# Patient Record
Sex: Male | Born: 1975 | Race: Black or African American | Hispanic: No | Marital: Married | Smoking: Never smoker
Health system: Southern US, Community
[De-identification: ages and names within clinical notes are randomized; demographics above are authoritative.]

## PROBLEM LIST (undated history)

## (undated) DIAGNOSIS — I10 Essential (primary) hypertension: Secondary | ICD-10-CM

## (undated) DIAGNOSIS — E119 Type 2 diabetes mellitus without complications: Secondary | ICD-10-CM

---

## 2002-05-26 ENCOUNTER — Other Ambulatory Visit: Admission: RE | Admit: 2002-05-26 | Discharge: 2002-05-26 | Payer: Self-pay | Admitting: General Surgery

## 2002-07-23 ENCOUNTER — Ambulatory Visit (HOSPITAL_COMMUNITY): Admission: RE | Admit: 2002-07-23 | Discharge: 2002-07-23 | Payer: Self-pay | Admitting: Family Medicine

## 2002-07-23 ENCOUNTER — Encounter: Payer: Self-pay | Admitting: Family Medicine

## 2004-12-24 ENCOUNTER — Ambulatory Visit: Payer: Self-pay | Admitting: Internal Medicine

## 2006-02-12 ENCOUNTER — Ambulatory Visit: Payer: Self-pay | Admitting: Family Medicine

## 2006-03-12 ENCOUNTER — Ambulatory Visit: Payer: Self-pay | Admitting: Family Medicine

## 2006-05-08 ENCOUNTER — Encounter (INDEPENDENT_AMBULATORY_CARE_PROVIDER_SITE_OTHER): Payer: Self-pay | Admitting: Family Medicine

## 2006-05-13 ENCOUNTER — Ambulatory Visit: Payer: Self-pay | Admitting: Family Medicine

## 2006-08-22 ENCOUNTER — Ambulatory Visit: Payer: Self-pay | Admitting: Family Medicine

## 2006-09-23 ENCOUNTER — Encounter (INDEPENDENT_AMBULATORY_CARE_PROVIDER_SITE_OTHER): Payer: Self-pay | Admitting: Family Medicine

## 2006-11-21 ENCOUNTER — Ambulatory Visit: Payer: Self-pay | Admitting: Family Medicine

## 2006-11-21 DIAGNOSIS — E669 Obesity, unspecified: Secondary | ICD-10-CM

## 2006-11-21 DIAGNOSIS — L290 Pruritus ani: Secondary | ICD-10-CM | POA: Insufficient documentation

## 2006-11-21 DIAGNOSIS — I10 Essential (primary) hypertension: Secondary | ICD-10-CM | POA: Insufficient documentation

## 2006-11-21 DIAGNOSIS — J309 Allergic rhinitis, unspecified: Secondary | ICD-10-CM | POA: Insufficient documentation

## 2006-11-21 LAB — CONVERTED CEMR LAB
Cholesterol, target level: 200 mg/dL
HDL goal, serum: 40 mg/dL
LDL Goal: 160 mg/dL

## 2006-11-26 ENCOUNTER — Encounter (INDEPENDENT_AMBULATORY_CARE_PROVIDER_SITE_OTHER): Payer: Self-pay | Admitting: Family Medicine

## 2006-11-27 LAB — CONVERTED CEMR LAB
ALT: 38 units/L (ref 0–53)
AST: 21 units/L (ref 0–37)
Albumin: 4.4 g/dL (ref 3.5–5.2)
Alkaline Phosphatase: 45 units/L (ref 39–117)
BUN: 15 mg/dL (ref 6–23)
Basophils Absolute: 0 10*3/uL (ref 0.0–0.1)
Basophils Relative: 0 % (ref 0–1)
CO2: 23 meq/L (ref 19–32)
Calcium: 9.1 mg/dL (ref 8.4–10.5)
Chloride: 104 meq/L (ref 96–112)
Cholesterol: 165 mg/dL (ref 0–200)
Creatinine, Ser: 0.95 mg/dL (ref 0.40–1.50)
Eosinophils Absolute: 0.1 10*3/uL (ref 0.0–0.7)
Eosinophils Relative: 1 % (ref 0–5)
Glucose, Bld: 93 mg/dL (ref 70–99)
HCT: 46.3 % (ref 39.0–52.0)
HDL: 44 mg/dL (ref >39)
Hemoglobin: 15.8 g/dL (ref 13.0–17.0)
LDL Cholesterol: 104 mg/dL — ABNORMAL HIGH (ref 0–99)
Lymphocytes Relative: 22 % (ref 12–46)
Lymphs Abs: 1.7 10*3/uL (ref 0.7–3.3)
MCHC: 34.1 g/dL (ref 30.0–36.0)
MCV: 88.5 fL (ref 78.0–100.0)
Monocytes Absolute: 0.6 10*3/uL (ref 0.2–0.7)
Monocytes Relative: 7 % (ref 3–11)
Neutro Abs: 5.3 10*3/uL (ref 1.7–7.7)
Neutrophils Relative %: 69 % (ref 43–77)
Platelets: 205 10*3/uL (ref 150–400)
Potassium: 4 meq/L (ref 3.5–5.3)
RBC: 5.23 M/uL (ref 4.22–5.81)
RDW: 13.8 % (ref 11.5–14.0)
Sodium: 142 meq/L (ref 135–145)
Total Bilirubin: 0.7 mg/dL (ref 0.3–1.2)
Total CHOL/HDL Ratio: 3.8
Total Protein: 7 g/dL (ref 6.0–8.3)
Triglycerides: 83 mg/dL (ref <150)
VLDL: 17 mg/dL (ref 0–40)
WBC: 7.8 10*3/uL (ref 4.0–10.5)

## 2007-02-20 ENCOUNTER — Ambulatory Visit: Payer: Self-pay | Admitting: Family Medicine

## 2007-03-11 ENCOUNTER — Encounter (INDEPENDENT_AMBULATORY_CARE_PROVIDER_SITE_OTHER): Payer: Self-pay | Admitting: Family Medicine

## 2007-04-09 ENCOUNTER — Ambulatory Visit: Payer: Self-pay | Admitting: Family Medicine

## 2007-06-08 ENCOUNTER — Encounter (INDEPENDENT_AMBULATORY_CARE_PROVIDER_SITE_OTHER): Payer: Self-pay | Admitting: Family Medicine

## 2007-07-09 ENCOUNTER — Ambulatory Visit: Payer: Self-pay | Admitting: Family Medicine

## 2007-07-10 ENCOUNTER — Telehealth (INDEPENDENT_AMBULATORY_CARE_PROVIDER_SITE_OTHER): Payer: Self-pay | Admitting: *Deleted

## 2007-07-10 LAB — CONVERTED CEMR LAB
BUN: 16 mg/dL (ref 6–23)
CO2: 21 meq/L (ref 19–32)
Calcium: 9.7 mg/dL (ref 8.4–10.5)
Chloride: 102 meq/L (ref 96–112)
Creatinine, Ser: 1.1 mg/dL (ref 0.40–1.50)
Glucose, Bld: 100 mg/dL — ABNORMAL HIGH (ref 70–99)
Potassium: 4.2 meq/L (ref 3.5–5.3)
Sodium: 139 meq/L (ref 135–145)

## 2007-07-27 ENCOUNTER — Ambulatory Visit: Payer: Self-pay | Admitting: Family Medicine

## 2007-09-23 ENCOUNTER — Ambulatory Visit: Payer: Self-pay | Admitting: Family Medicine

## 2008-01-26 ENCOUNTER — Ambulatory Visit: Payer: Self-pay | Admitting: Family Medicine

## 2008-01-26 LAB — CONVERTED CEMR LAB
Bilirubin Urine: NEGATIVE
Blood in Urine, dipstick: NEGATIVE
Glucose, Urine, Semiquant: NEGATIVE
Ketones, urine, test strip: NEGATIVE
Nitrite: NEGATIVE
Protein, U semiquant: NEGATIVE
Specific Gravity, Urine: 1.01
Urobilinogen, UA: 0.2
WBC Urine, dipstick: NEGATIVE
pH: 5.5

## 2008-01-28 LAB — CONVERTED CEMR LAB
BUN: 15 mg/dL (ref 6–23)
Basophils Absolute: 0 10*3/uL (ref 0.0–0.1)
Basophils Relative: 0 % (ref 0–1)
CO2: 20 meq/L (ref 19–32)
Calcium: 9.5 mg/dL (ref 8.4–10.5)
Chloride: 104 meq/L (ref 96–112)
Creatinine, Ser: 1.06 mg/dL (ref 0.40–1.50)
Eosinophils Absolute: 0.1 10*3/uL (ref 0.0–0.7)
Eosinophils Relative: 1 % (ref 0–5)
Glucose, Bld: 146 mg/dL — ABNORMAL HIGH (ref 70–99)
HCT: 45.5 % (ref 39.0–52.0)
Hemoglobin: 15.6 g/dL (ref 13.0–17.0)
Lymphocytes Relative: 18 % (ref 12–46)
Lymphs Abs: 1.6 10*3/uL (ref 0.7–4.0)
MCHC: 34.3 g/dL (ref 30.0–36.0)
MCV: 86.8 fL (ref 78.0–100.0)
Monocytes Absolute: 0.5 10*3/uL (ref 0.1–1.0)
Monocytes Relative: 5 % (ref 3–12)
Neutro Abs: 6.6 10*3/uL (ref 1.7–7.7)
Neutrophils Relative %: 75 % (ref 43–77)
Platelets: 209 10*3/uL (ref 150–400)
Potassium: 3.5 meq/L (ref 3.5–5.3)
RBC: 5.24 M/uL (ref 4.22–5.81)
RDW: 13.5 % (ref 11.5–15.5)
Sodium: 139 meq/L (ref 135–145)
WBC: 8.8 10*3/uL (ref 4.0–10.5)

## 2008-06-24 ENCOUNTER — Telehealth (INDEPENDENT_AMBULATORY_CARE_PROVIDER_SITE_OTHER): Payer: Self-pay | Admitting: *Deleted

## 2008-08-16 ENCOUNTER — Ambulatory Visit: Payer: Self-pay | Admitting: Family Medicine

## 2008-08-16 DIAGNOSIS — J209 Acute bronchitis, unspecified: Secondary | ICD-10-CM

## 2008-10-05 ENCOUNTER — Ambulatory Visit: Payer: Self-pay | Admitting: Family Medicine

## 2008-10-12 ENCOUNTER — Encounter (INDEPENDENT_AMBULATORY_CARE_PROVIDER_SITE_OTHER): Payer: Self-pay | Admitting: Family Medicine

## 2008-10-13 LAB — CONVERTED CEMR LAB
ALT: 35 units/L (ref 0–53)
AST: 20 units/L (ref 0–37)
Albumin: 4.6 g/dL (ref 3.5–5.2)
Alkaline Phosphatase: 45 units/L (ref 39–117)
BUN: 16 mg/dL (ref 6–23)
Band Neutrophils: 0 % (ref 0–10)
Basophils Absolute: 0 10*3/uL (ref 0.0–0.1)
Basophils Relative: 0 % (ref 0–1)
CO2: 24 meq/L (ref 19–32)
Calcium: 9.5 mg/dL (ref 8.4–10.5)
Chloride: 102 meq/L (ref 96–112)
Cholesterol: 151 mg/dL (ref 0–200)
Creatinine, Ser: 0.99 mg/dL (ref 0.40–1.50)
Eosinophils Absolute: 0.1 10*3/uL (ref 0.0–0.7)
Eosinophils Relative: 1 % (ref 0–5)
Glucose, Bld: 98 mg/dL (ref 70–99)
HCT: 44.5 % (ref 39.0–52.0)
HDL: 45 mg/dL (ref >39)
Hemoglobin: 15.3 g/dL (ref 13.0–17.0)
LDL Cholesterol: 95 mg/dL (ref 0–99)
Lymphocytes Relative: 26 % (ref 12–46)
Lymphs Abs: 2 10*3/uL (ref 0.7–4.0)
MCHC: 34.4 g/dL (ref 30.0–36.0)
MCV: 86.6 fL (ref 78.0–100.0)
Monocytes Absolute: 0.5 10*3/uL (ref 0.1–1.0)
Monocytes Relative: 7 % (ref 3–12)
Neutro Abs: 4.9 10*3/uL (ref 1.7–7.7)
Neutrophils Relative %: 65 % (ref 43–77)
Platelets: 204 10*3/uL (ref 150–400)
Potassium: 4.2 meq/L (ref 3.5–5.3)
RBC: 5.14 M/uL (ref 4.22–5.81)
RDW: 14.4 % (ref 11.5–15.5)
Sodium: 141 meq/L (ref 135–145)
TSH: 1.386 microintl units/mL (ref 0.350–4.500)
Total Bilirubin: 0.6 mg/dL (ref 0.3–1.2)
Total CHOL/HDL Ratio: 3.4
Total Protein: 7.5 g/dL (ref 6.0–8.3)
Triglycerides: 54 mg/dL (ref <150)
VLDL: 11 mg/dL (ref 0–40)
WBC: 7.5 10*3/uL (ref 4.0–10.5)

## 2018-12-22 ENCOUNTER — Other Ambulatory Visit (HOSPITAL_COMMUNITY): Payer: Self-pay | Admitting: Family Medicine

## 2018-12-22 DIAGNOSIS — IMO0002 Reserved for concepts with insufficient information to code with codable children: Secondary | ICD-10-CM

## 2018-12-22 DIAGNOSIS — Z803 Family history of malignant neoplasm of breast: Secondary | ICD-10-CM

## 2018-12-29 ENCOUNTER — Encounter (HOSPITAL_COMMUNITY): Payer: Self-pay

## 2018-12-29 ENCOUNTER — Ambulatory Visit (HOSPITAL_COMMUNITY): Payer: BC Managed Care – PPO

## 2019-03-01 ENCOUNTER — Other Ambulatory Visit (HOSPITAL_COMMUNITY): Payer: Self-pay | Admitting: Family Medicine

## 2019-03-01 ENCOUNTER — Ambulatory Visit (HOSPITAL_COMMUNITY)
Admission: RE | Admit: 2019-03-01 | Discharge: 2019-03-01 | Disposition: A | Discharge status: Home / Self Care | Payer: BC Managed Care – PPO | Source: Ambulatory Visit | Attending: Family Medicine | Admitting: Family Medicine

## 2019-03-01 ENCOUNTER — Other Ambulatory Visit: Payer: Self-pay

## 2019-03-01 DIAGNOSIS — R609 Edema, unspecified: Secondary | ICD-10-CM | POA: Diagnosis present

## 2019-09-02 ENCOUNTER — Ambulatory Visit: Payer: BC Managed Care – PPO

## 2019-09-02 ENCOUNTER — Encounter: Payer: Self-pay | Admitting: Orthopaedic Surgery

## 2019-09-02 ENCOUNTER — Other Ambulatory Visit: Payer: Self-pay

## 2019-09-02 ENCOUNTER — Ambulatory Visit (INDEPENDENT_AMBULATORY_CARE_PROVIDER_SITE_OTHER): Payer: BC Managed Care – PPO | Admitting: Orthopaedic Surgery

## 2019-09-02 VITALS — BP 121/80 | HR 83 | Ht 66.5 in | Wt 290.0 lb

## 2019-09-02 DIAGNOSIS — Z6841 Body Mass Index (BMI) 40.0 and over, adult: Secondary | ICD-10-CM | POA: Diagnosis not present

## 2019-09-02 DIAGNOSIS — M654 Radial styloid tenosynovitis [de Quervain]: Secondary | ICD-10-CM | POA: Diagnosis not present

## 2019-09-02 DIAGNOSIS — M25531 Pain in right wrist: Secondary | ICD-10-CM

## 2019-09-02 NOTE — Progress Notes (Signed)
Subjective:    Patient ID: Jeremiah Garner, male    DOB: 04-Dec-1975, 44 y.o.   MRN: [id number]  HPI He has had pain in the right dominant wrist over the first extensor compartment for about two to three months.  It hurts to lift a heavy object.  He has no swelling, no trauma, no redness. He has seen Jeremiah Garner for this and I have reviewed the notes.  It is not getting better with rest, heat, Advil.   Review of Systems  Constitutional: Positive for activity change.  Musculoskeletal: Positive for arthralgias.  All other systems reviewed and are negative.  For Review of Systems, all other systems reviewed and are negative.  The following is a summary of the past history medically, past history surgically, known current medicines, social history and family history.  This information is gathered electronically by the computer from prior information and documentation.  I review this each visit and have found including this information at this point in the chart is beneficial and informative.   History reviewed. No pertinent past medical history.  History reviewed. No pertinent surgical history.  Current Outpatient Medications on File Prior to Visit  Medication Sig Dispense Refill  . diltiazem (TIAZAC) 360 MG 24 hr capsule Take 360 mg by mouth daily.    . furosemide (LASIX) 20 MG tablet Take 20 mg by mouth daily.    Marland Kitchen ibuprofen (ADVIL) 800 MG tablet     . spironolactone (ALDACTONE) 25 MG tablet Take 25 mg by mouth 2 (two) times daily.     No current facility-administered medications on file prior to visit.    Social History   Socioeconomic History  . Marital status: Married    Spouse name: Not on file  . Number of children: Not on file  . Years of education: Not on file  . Highest education level: Not on file  Occupational History  . Not on file  Tobacco Use  . Smoking status: Never Smoker  . Smokeless tobacco: Never Used  Substance and Sexual Activity  . Alcohol use: Not on  file  . Drug use: Not on file  . Sexual activity: Not on file  Other Topics Concern  . Not on file  Social History Narrative  . Not on file   Social Determinants of Health   Financial Resource Strain:   . Difficulty of Paying Living Expenses: Not on file  Food Insecurity:   . Worried About Programme researcher, broadcasting/film/video in the Last Year: Not on file  . Ran Out of Food in the Last Year: Not on file  Transportation Needs:   . Lack of Transportation (Medical): Not on file  . Lack of Transportation (Non-Medical): Not on file  Physical Activity:   . Days of Exercise per Week: Not on file  . Minutes of Exercise per Session: Not on file  Stress:   . Feeling of Stress : Not on file  Social Connections:   . Frequency of Communication with Friends and Family: Not on file  . Frequency of Social Gatherings with Friends and Family: Not on file  . Attends Religious Services: Not on file  . Active Member of Clubs or Organizations: Not on file  . Attends Banker Meetings: Not on file  . Marital Status: Not on file  Intimate Partner Violence:   . Fear of Current or Ex-Partner: Not on file  . Emotionally Abused: Not on file  . Physically Abused: Not on file  .  Sexually Abused: Not on file    Family History  Problem Relation Age of Onset  . Cancer Mother        breast   . High blood pressure Father   . Parkinson's disease Father     BP 121/80   Pulse 83   Ht 5' 6.5" (1.689 m)   Wt 290 lb (131.5 kg)   BMI 46.11 kg/m   Body mass index is 46.11 kg/m.  The patient meets the AMA guidelines for Morbid (severe) obesity with a BMI > 40.0 and I have recommended weight loss.      Objective:   Physical Exam Vitals and nursing note reviewed.  Constitutional:      Appearance: He is well-developed.  HENT:     Head: Normocephalic and atraumatic.  Eyes:     Conjunctiva/sclera: Conjunctivae normal.     Pupils: Pupils are equal, round, and reactive to light.  Cardiovascular:      Rate and Rhythm: Normal rate and regular rhythm.  Pulmonary:     Effort: Pulmonary effort is normal.  Abdominal:     Palpations: Abdomen is soft.  Musculoskeletal:       Hands:     Cervical back: Normal range of motion and neck supple.  Skin:    General: Skin is warm and dry.  Neurological:     Mental Status: He is alert and oriented to person, place, and time.     Cranial Nerves: No cranial nerve deficit.     Motor: No abnormal muscle tone.     Coordination: Coordination normal.     Deep Tendon Reflexes: Reflexes are normal and symmetric. Reflexes normal.  Psychiatric:        Behavior: Behavior normal.        Thought Content: Thought content normal.        Judgment: Judgment normal.    X-rays were done of the right wrist, reported separately.       Assessment & Plan:   Encounter Diagnoses  Name Primary?  . Pain in right wrist Yes  . De Quervain's tenosynovitis, right   . Body mass index 45.0-49.9, adult ([place])   . Morbid obesity ([place])    Procedure note: After permission from the patient and sterile prep, the first extensor compartment of the right wrist was injected with 1% Xylocaine plain and 1 cc of DepoMedrol 40 by sterile technique and tolerated well.  A thumb splint is given.  Use Aspercreme to the area tid.  Use ice as needed.  Return in two weeks.  Call if any problem.  Precautions discussed.   Electronically Signed Jeremiah Kava, MD 1/28/20218:39 AM

## 2019-09-16 ENCOUNTER — Ambulatory Visit (INDEPENDENT_AMBULATORY_CARE_PROVIDER_SITE_OTHER): Payer: BC Managed Care – PPO | Admitting: Orthopaedic Surgery

## 2019-09-16 ENCOUNTER — Encounter: Payer: Self-pay | Admitting: Orthopaedic Surgery

## 2019-09-16 ENCOUNTER — Other Ambulatory Visit: Payer: Self-pay

## 2019-09-16 VITALS — BP 132/90 | HR 80 | Ht 66.5 in | Wt 285.0 lb

## 2019-09-16 DIAGNOSIS — M654 Radial styloid tenosynovitis [de Quervain]: Secondary | ICD-10-CM | POA: Diagnosis not present

## 2019-09-16 DIAGNOSIS — Z6841 Body Mass Index (BMI) 40.0 and over, adult: Secondary | ICD-10-CM

## 2019-09-16 NOTE — Progress Notes (Signed)
Patient MW:NUUVOZD Jeremiah Garner, male DOB:1976-06-20, 44 y.o. GUY:[id number]  Chief Complaint  Patient presents with  . Hand Pain    right/ feels better     HPI  Jeremiah Garner is a 44 y.o. male who has Harriet Pho of the right dominant hand first compartment.  I gave him an injection last time and it has helped.  He has been using his brace.  He has no swelling.  He has some twinges of pain but is much improved.  I told him to stop the splint and use Aspercreme or BioFreeze as needed.   Body mass index is 45.31 kg/m.  The patient meets the AMA guidelines for Morbid (severe) obesity with a BMI > 40.0 and I have recommended weight loss.   ROS  Review of Systems  Constitutional: Positive for activity change.  Musculoskeletal: Positive for arthralgias.  All other systems reviewed and are negative.   All other systems reviewed and are negative.  The following is a summary of the past history medically, past history surgically, known current medicines, social history and family history.  This information is gathered electronically by the computer from prior information and documentation.  I review this each visit and have found including this information at this point in the chart is beneficial and informative.    History reviewed. No pertinent past medical history.  History reviewed. No pertinent surgical history.  Family History  Problem Relation Age of Onset  . Cancer Mother        breast   . High blood pressure Father   . Parkinson's disease Father     Social History Social History   Tobacco Use  . Smoking status: Never Smoker  . Smokeless tobacco: Never Used  Substance Use Topics  . Alcohol use: Not on file  . Drug use: Not on file    Not on File  Current Outpatient Medications  Medication Sig Dispense Refill  . diltiazem (TIAZAC) 360 MG 24 hr capsule Take 360 mg by mouth daily.    . furosemide (LASIX) 20 MG tablet Take 20 mg by mouth daily.    Marland Kitchen ibuprofen  (ADVIL) 800 MG tablet     . spironolactone (ALDACTONE) 25 MG tablet Take 25 mg by mouth 2 (two) times daily.     No current facility-administered medications for this visit.     Physical Exam  Blood pressure 132/90, pulse 80, height 5' 6.5" (1.689 m), weight 285 lb (129.3 kg).  Constitutional: overall normal hygiene, normal nutrition, well developed, normal grooming, normal body habitus. Assistive device:right thumb splint  Musculoskeletal: gait and station Limp none, muscle tone and strength are normal, no tremors or atrophy is present.  .  Neurological: coordination overall normal.  Deep tendon reflex/nerve stretch intact.  Sensation normal.  Cranial nerves II-XII intact.   Skin:   Normal overall no scars, lesions, ulcers or rashes. No psoriasis.  Psychiatric: Alert and oriented x 3.  Recent memory intact, remote memory unclear.  Normal mood and affect. Well groomed.  Good eye contact.  Cardiovascular: overall no swelling, no varicosities, no edema bilaterally, normal temperatures of the legs and arms, no clubbing, cyanosis and good capillary refill.  Lymphatic: palpation is normal.  His right thumb first extensor compartment is tender but not painful. He has negative Finklestein sign.  He has full ROM and no swelling or redness.  NV intact.  All other systems reviewed and are negative   The patient has been educated about the nature of  the problem(s) and counseled on treatment options.  The patient appeared to understand what I have discussed and is in agreement with it.  Encounter Diagnoses  Name Primary?  Tommi Rumps Quervain's tenosynovitis, right Yes  . Body mass index 45.0-49.9, adult ([facility])   . Morbid obesity ([facility])     PLAN Call if any problems.  Precautions discussed.  Continue current medications.   Return to clinic 1 month   Electronically Signed Darreld Mclean, MD 2/11/20218:36 AM

## 2019-10-14 ENCOUNTER — Ambulatory Visit: Payer: BC Managed Care – PPO | Attending: Family

## 2019-10-14 ENCOUNTER — Ambulatory Visit: Payer: BC Managed Care – PPO | Admitting: Orthopaedic Surgery

## 2019-10-14 DIAGNOSIS — Z23 Encounter for immunization: Secondary | ICD-10-CM

## 2019-10-14 NOTE — Progress Notes (Signed)
   Covid-19 Vaccination Clinic  Name:  Jeremiah Garner    MRN: [id number] DOB: 1976-01-24  10/14/2019  [name] was observed post Covid-19 immunization for 15 minutes without incident. He was provided with Vaccine Information Sheet and instruction to access the V-Safe system.   [name] was instructed to call 911 with any severe reactions post vaccine: Marland Kitchen Difficulty breathing  . Swelling of face and throat  . A fast heartbeat  . A bad rash all over body  . Dizziness and weakness   Immunizations Administered    Name Date Dose VIS Date Route   Moderna COVID-19 Vaccine 10/14/2019 10:35 AM 0.5 mL 07/06/2019 Intramuscular   Manufacturer: Moderna   Lot: [id number]   NDC: 16553-748-27

## 2019-11-16 ENCOUNTER — Ambulatory Visit: Payer: BC Managed Care – PPO | Attending: Family

## 2019-11-16 DIAGNOSIS — Z23 Encounter for immunization: Secondary | ICD-10-CM

## 2019-11-16 NOTE — Progress Notes (Signed)
   Covid-19 Vaccination Clinic  Name:  Jeremiah Garner    MRN: [id number] DOB: 1975-08-22  11/16/2019  [name] was observed post Covid-19 immunization for 15 minutes without incident. He was provided with Vaccine Information Sheet and instruction to access the V-Safe system.   [name] was instructed to call 911 with any severe reactions post vaccine: Marland Kitchen Difficulty breathing  . Swelling of face and throat  . A fast heartbeat  . A bad rash all over body  . Dizziness and weakness   Immunizations Administered    Name Date Dose VIS Date Route   Moderna COVID-19 Vaccine 11/16/2019 11:23 AM 0.5 mL 07/06/2019 Intramuscular   Manufacturer: Moderna   Lot: [id number]   NDC: 49865-168-61

## 2020-06-05 ENCOUNTER — Ambulatory Visit: Payer: BC Managed Care – PPO | Attending: Family

## 2020-06-05 DIAGNOSIS — Z23 Encounter for immunization: Secondary | ICD-10-CM

## 2020-08-02 NOTE — Progress Notes (Signed)
   Covid-19 Vaccination Clinic  Name:  Jeremiah Garner    MRN: [id number] DOB: 08-01-1976  08/02/2020  [name] was observed post Covid-19 immunization for 15 minutes without incident. He was provided with Vaccine Information Sheet and instruction to access the V-Safe system.   [name] was instructed to call 911 with any severe reactions post vaccine: Marland Kitchen Difficulty breathing  . Swelling of face and throat  . A fast heartbeat  . A bad rash all over body  . Dizziness and weakness   Immunizations Administered    Name Date Dose VIS Date Route   Moderna Covid-19 Booster Vaccine 06/05/2020 11:30 AM 0.25 mL 05/24/2020 Intramuscular   Manufacturer: Moderna   Lot: [id number]   NDC: 47425-956-38

## 2020-08-22 ENCOUNTER — Ambulatory Visit (INDEPENDENT_AMBULATORY_CARE_PROVIDER_SITE_OTHER): Payer: BC Managed Care – PPO | Admitting: Orthopaedic Surgery

## 2020-08-22 ENCOUNTER — Encounter: Payer: Self-pay | Admitting: Orthopaedic Surgery

## 2020-08-22 ENCOUNTER — Other Ambulatory Visit: Payer: Self-pay

## 2020-08-22 VITALS — BP 134/84 | HR 82 | Ht 66.5 in | Wt 289.0 lb

## 2020-08-22 DIAGNOSIS — M654 Radial styloid tenosynovitis [de Quervain]: Secondary | ICD-10-CM

## 2020-08-22 DIAGNOSIS — Z6841 Body Mass Index (BMI) 40.0 and over, adult: Secondary | ICD-10-CM

## 2020-08-22 NOTE — Progress Notes (Signed)
He has recurrent Jeremiah Garner on the right.  Procedure note:  After permission from the patient the first extensor compartment of the right wrist was prepped and injected with 1 cc DepoMedrol 40 and 1 % plain Xylocaine by sterile technique tolerated well.  A thumb splint was given.  Encounter Diagnoses  Name Primary?  Tommi Rumps Quervain's tenosynovitis, right Yes  . Body mass index 45.0-49.9, adult ([facility])   . Morbid obesity ([facility])    Return as needed.  Call if any problem.  Precautions discussed.   Electronically Signed Darreld Mclean, MD 1/18/202212:20 PM

## 2020-12-18 ENCOUNTER — Encounter: Payer: Self-pay | Admitting: Emergency Medicine

## 2020-12-18 ENCOUNTER — Ambulatory Visit
Admission: EM | Admit: 2020-12-18 | Discharge: 2020-12-18 | Disposition: A | Discharge status: Home / Self Care | Payer: BC Managed Care – PPO | Attending: Family Medicine | Admitting: Family Medicine

## 2020-12-18 ENCOUNTER — Other Ambulatory Visit: Payer: Self-pay

## 2020-12-18 DIAGNOSIS — J209 Acute bronchitis, unspecified: Secondary | ICD-10-CM

## 2020-12-18 DIAGNOSIS — J019 Acute sinusitis, unspecified: Secondary | ICD-10-CM

## 2020-12-18 MED ORDER — IPRATROPIUM BROMIDE 0.03 % NA SOLN: 2.0000 | Freq: Three times a day (TID) | NASAL | 0 refills | Status: DC | PRN

## 2020-12-18 MED ORDER — PROMETHAZINE-DM 6.25-15 MG/5ML PO SYRP
5.0000 mL | ORAL_SOLUTION | Freq: Three times a day (TID) | ORAL | 0 refills | Status: DC | PRN
Start: 2020-12-18 — End: 2022-10-30

## 2020-12-18 MED ORDER — DOXYCYCLINE HYCLATE 100 MG PO CAPS: 100.0000 mg | ORAL_CAPSULE | Freq: Two times a day (BID) | ORAL | 0 refills | Status: AC

## 2020-12-18 NOTE — ED Triage Notes (Signed)
Pt here for constant runny nose onset 4 days associated w/nausea  Denies f/v/d, SOB, dyspnea  Taking Flonase nasal spray w/some relief  A&O x4... NAD.Marland Kitchen. ambulatory

## 2020-12-18 NOTE — ED Provider Notes (Signed)
[facility]    CSN: [id number] Arrival date & time: 12/18/20  1604      History   Chief Complaint Chief Complaint  Patient presents with  . URI    HPI Jeremiah Garner is a 45 y.o. male.   HPI  Patient presents with URI symptoms including cough, nasal congestion, runny nose, and nausea x 4 days. He uses a CPAP at bedtime and is concern for infection related to machine. Cough is non productive. Most worrisome symptoms nasal congestion and drainage. Afebrile a present. Denies worrisome symptoms of shortness of breath, weakness, N&V,or  chest pain.   History reviewed. No pertinent past medical history.  Patient Active Problem List   Diagnosis Date Noted  . BRONCHITIS, ACUTE 08/16/2008  . OBESITY 11/21/2006  . ESSENTIAL HYPERTENSION 11/21/2006  . ALLERGIC RHINITIS 11/21/2006  . PRURITUS ANI 11/21/2006    History reviewed. No pertinent surgical history.     Home Medications    Prior to Admission medications   Medication Sig Start Date End Date Taking? Authorizing Provider  doxycycline (VIBRAMYCIN) 100 MG capsule Take 1 capsule (100 mg total) by mouth 2 (two) times daily for 7 days. 12/18/20 12/25/20 Yes Bing Neighbors, FNP  ipratropium (ATROVENT) 0.03 % nasal spray Place 2 sprays into both nostrils 3 (three) times daily as needed for rhinitis. 12/18/20  Yes Bing Neighbors, FNP  promethazine-dextromethorphan (PROMETHAZINE-DM) 6.25-15 MG/5ML syrup Take 5 mLs by mouth 3 (three) times daily as needed for cough. 12/18/20  Yes Bing Neighbors, FNP  diltiazem (TIAZAC) 360 MG 24 hr capsule Take 360 mg by mouth daily. 07/21/19   [provider]  furosemide (LASIX) 20 MG tablet Take 20 mg by mouth daily. 08/11/19   [provider]  ibuprofen (ADVIL) 800 MG tablet  08/09/19   [provider]  spironolactone (ALDACTONE) 25 MG tablet Take 25 mg by mouth 2 (two) times daily. 07/19/19   [provider]    Family History Family  History  Problem Relation Age of Onset  . Cancer Mother        breast   . High blood pressure Father   . Parkinson's disease Father     Social History Social History   Tobacco Use  . Smoking status: Never Smoker  . Smokeless tobacco: Never Used     Allergies   Patient has no known allergies.   Review of Systems Review of Systems Pertinent negatives listed in HPI   Physical Exam Triage Vital Signs ED Triage Vitals  Enc Vitals Group     BP 12/18/20 1652 125/80     Pulse Rate 12/18/20 1652 78     Resp 12/18/20 1652 18     Temp 12/18/20 1652 98 F (36.7 C)     Temp Source 12/18/20 1652 Oral     SpO2 12/18/20 1652 97 %     Weight --      Height --      Head Circumference --      Peak Flow --      Pain Score 12/18/20 1658 0     Pain Loc --      Pain Edu? --      Excl. in GC? --    No data found.  Updated Vital Signs BP 125/80   Pulse 78   Temp 98 F (36.7 C) (Oral)   Resp 18   SpO2 97%   Visual Acuity Right Eye Distance:   Left Eye Distance:  Bilateral Distance:    Right Eye Near:   Left Eye Near:    Bilateral Near:     Physical Exam General appearance: alert, Ill-appearing, no distress Head: Normocephalic, without obvious abnormality, atraumatic ENT:Ear normal, nares with mucosal edema, congestion, oropharynx w/o exudate Respiratory: Respirations even , unlabored, coarse lung sound, no wheezing or rales Heart: rate and rhythm normal. No gallop or murmurs noted on exam  Abdomen: BS +, no distention, no rebound tenderness, or no mass Extremities: No gross deformities Skin: Skin color, texture, turgor normal. No rashes seen  Psych: Appropriate mood and affect. Neurologic: no obvious neurological deficit present on exam  UC Treatments / Results  Labs (all labs ordered are listed, but only abnormal results are displayed) Labs Reviewed - No data to display  EKG   Radiology No results found.  Procedures Procedures (including critical care  time)  Medications Ordered in UC Medications - No data to display  Initial Impression / Assessment and Plan / UC Course  I have reviewed the triage vital signs and the nursing notes.  Pertinent labs & imaging results that were available during my care of the patient were reviewed by me and considered in my medical decision making (see chart for details).    Treating based on clinical findings which are consistent with both sinusitis and acute bronchitis, unable to rule out viral source as patient declined COVID/Flu testing. Treatment per discharge medications. PCP follow-up if symptoms do not readily resolve with prescribed therapy. ER if breathing difficulty develops.  Final Clinical Impressions(s) / UC Diagnoses   Final diagnoses:  Acute bronchitis, unspecified organism  Acute non-recurrent sinusitis, unspecified location   Discharge Instructions   None    ED Prescriptions    Medication Sig Dispense Auth. Provider   doxycycline (VIBRAMYCIN) 100 MG capsule Take 1 capsule (100 mg total) by mouth 2 (two) times daily for 7 days. 14 capsule Bing Neighbors, FNP   promethazine-dextromethorphan (PROMETHAZINE-DM) 6.25-15 MG/5ML syrup Take 5 mLs by mouth 3 (three) times daily as needed for cough. 140 mL Bing Neighbors, FNP   ipratropium (ATROVENT) 0.03 % nasal spray Place 2 sprays into both nostrils 3 (three) times daily as needed for rhinitis. 30 mL Bing Neighbors, FNP     PDMP not reviewed this encounter.   Bing Neighbors, FNP 12/20/20 [id number]

## 2021-02-06 ENCOUNTER — Ambulatory Visit: Payer: BC Managed Care – PPO | Attending: Family

## 2021-02-06 ENCOUNTER — Other Ambulatory Visit: Payer: Self-pay

## 2021-02-06 DIAGNOSIS — Z23 Encounter for immunization: Secondary | ICD-10-CM

## 2021-02-06 NOTE — Progress Notes (Signed)
   Covid-19 Vaccination Clinic  Name:  Jeremiah Garner    MRN: [id number] DOB: 1976-01-23  02/06/2021  [name] was observed post Covid-19 immunization for 15 minutes without incident. He was provided with Vaccine Information Sheet and instruction to access the V-Safe system.   [name] was instructed to call 911 with any severe reactions post vaccine: Difficulty breathing  Swelling of face and throat  A fast heartbeat  A bad rash all over body  Dizziness and weakness   Immunizations Administered     Name Date Dose VIS Date Route   Moderna Covid-19 Booster Vaccine 02/06/2021  5:10 PM 0.25 mL 05/24/2020 Intramuscular   Manufacturer: Moderna   Lot: [id number]   NDC: 44010-272-53

## 2021-03-14 IMAGING — DX CHEST - 2 VIEW
2 series · 2 of 2 positions shown · non-contrast
Comparison: None.

CLINICAL DATA: Chronic lower extremity edema.  Hypertension.

EXAM:
CHEST - 2 VIEW

[chest pa]
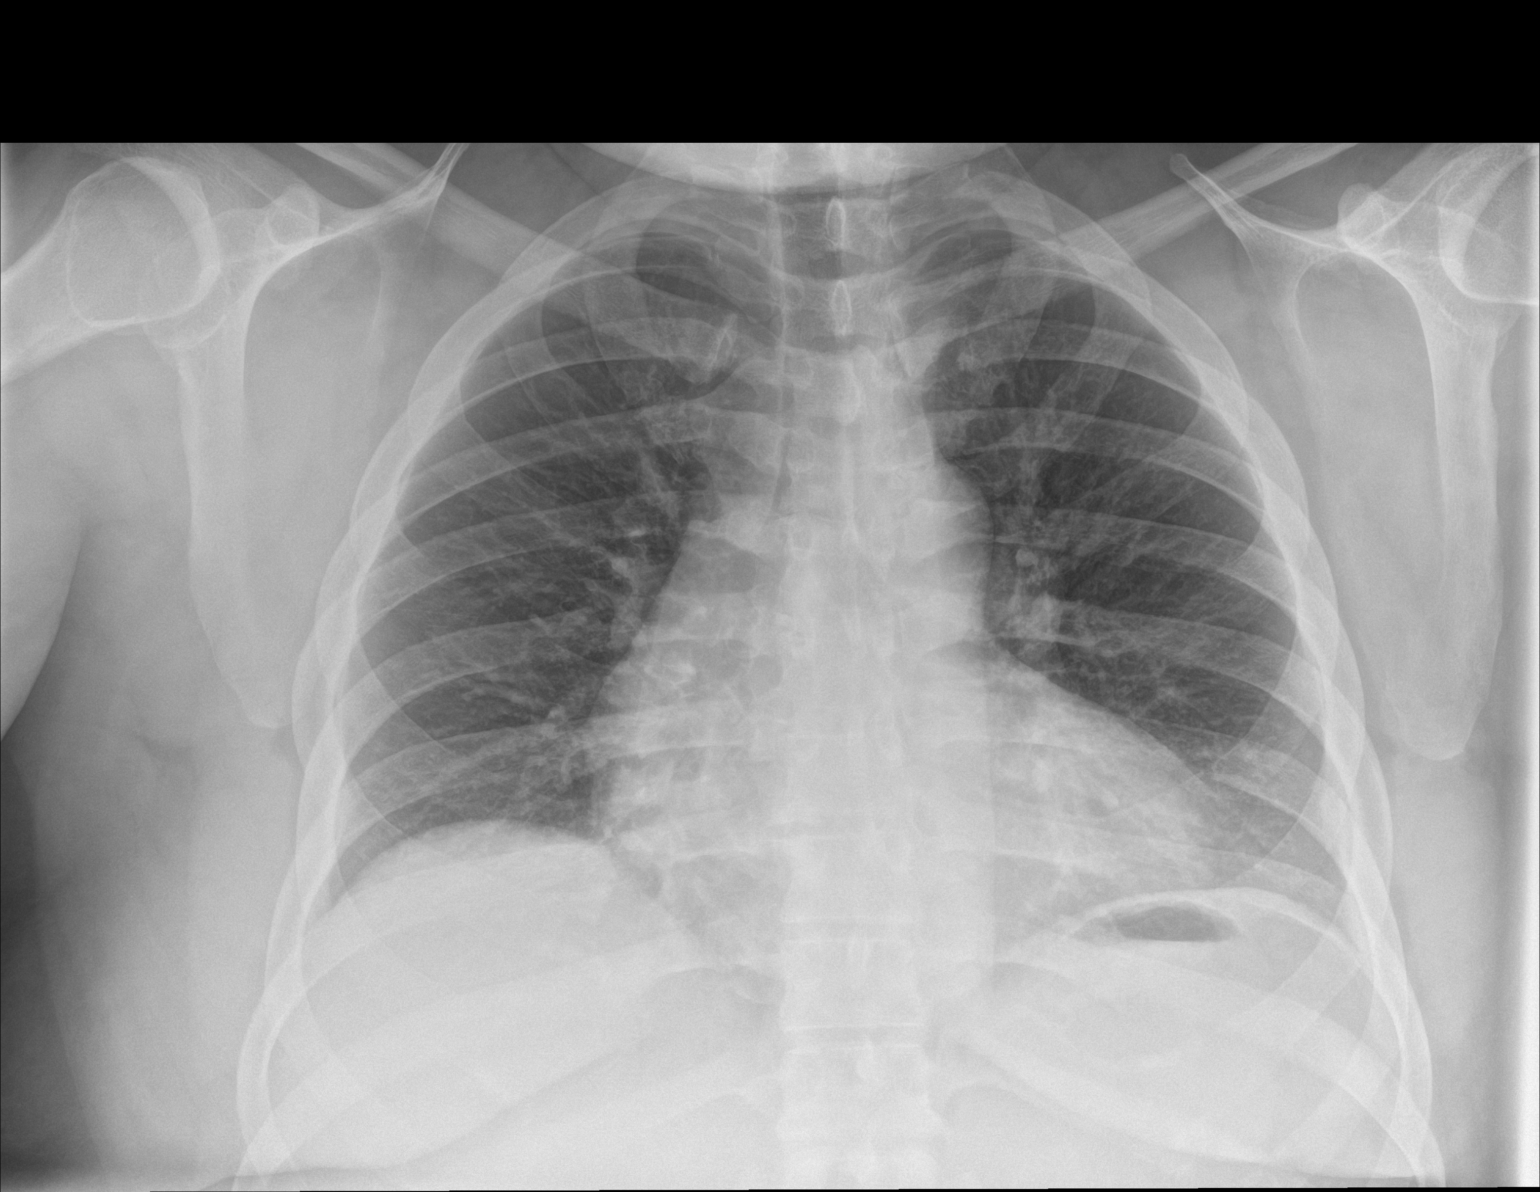

[chest lat]
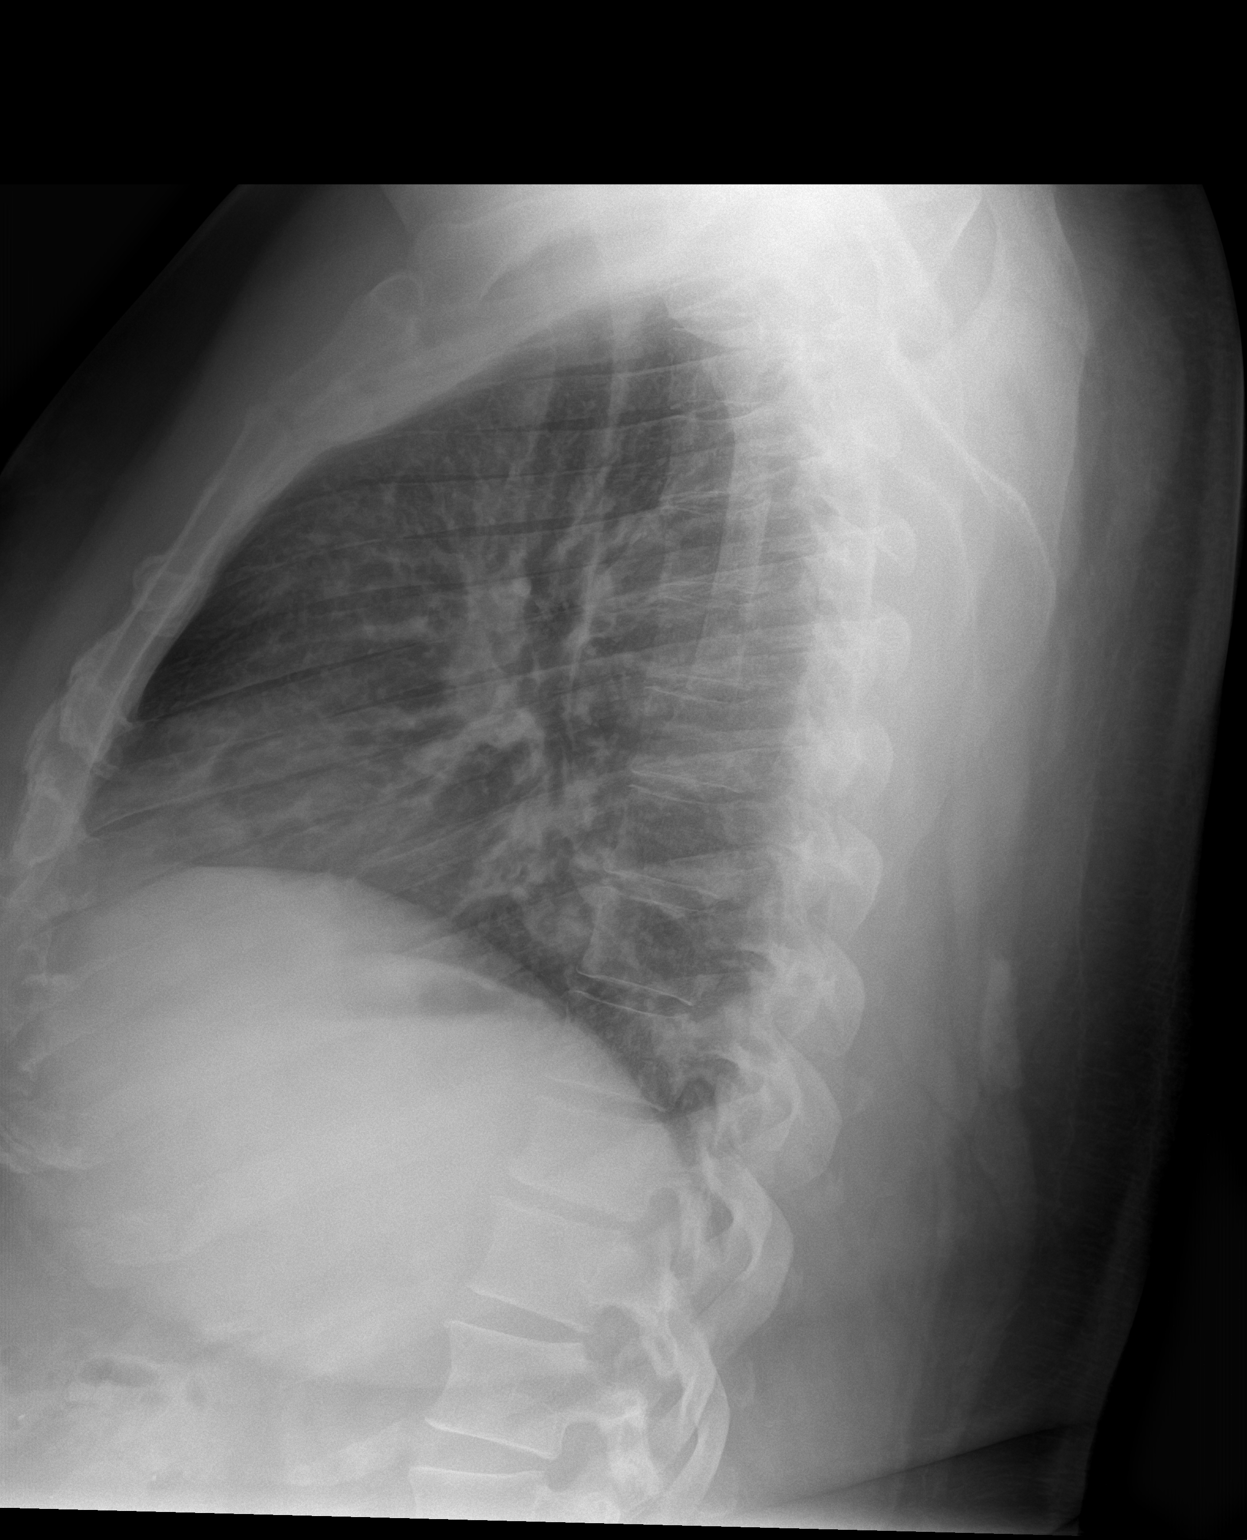

[2 of 2 positions shown; findings below may reference images not displayed]

FINDINGS: Mild enlargement of the heart. Negative for heart failure. Lungs
well aerated and clear. No infiltrate effusion or mass.
IMPRESSION: No active cardiopulmonary disease.

## 2021-06-25 ENCOUNTER — Ambulatory Visit: Payer: BC Managed Care – PPO | Attending: Family

## 2021-06-25 DIAGNOSIS — Z23 Encounter for immunization: Secondary | ICD-10-CM

## 2021-06-25 NOTE — Progress Notes (Signed)
   Covid-19 Vaccination Clinic  Name:  Jeremiah Garner    MRN: [id number] DOB: 02-05-1976  06/25/2021  [name] was observed post Covid-19 immunization for 15 minutes without incident. He was provided with Vaccine Information Sheet and instruction to access the V-Safe system.   [name] was instructed to call 911 with any severe reactions post vaccine: Difficulty breathing  Swelling of face and throat  A fast heartbeat  A bad rash all over body  Dizziness and weakness   Immunizations Administered     Name Date Dose VIS Date Route   Pfizer Covid-19 Vaccine Bivalent Booster 06/25/2021 12:00 PM 0.3 mL 04/04/2021 Intramuscular   Manufacturer: [employer]   Lot: [id number]   NDC: [id number]

## 2021-11-30 ENCOUNTER — Encounter: Payer: Self-pay | Admitting: Emergency Medicine

## 2021-11-30 ENCOUNTER — Other Ambulatory Visit: Payer: Self-pay

## 2021-11-30 ENCOUNTER — Ambulatory Visit
Admission: EM | Admit: 2021-11-30 | Discharge: 2021-11-30 | Disposition: A | Discharge status: Home / Self Care | Payer: BC Managed Care – PPO | Attending: Nurse Practitioner | Admitting: Nurse Practitioner

## 2021-11-30 DIAGNOSIS — J309 Allergic rhinitis, unspecified: Secondary | ICD-10-CM | POA: Diagnosis not present

## 2021-11-30 DIAGNOSIS — R051 Acute cough: Secondary | ICD-10-CM | POA: Diagnosis not present

## 2021-11-30 HISTORY — DX: Type 2 diabetes mellitus without complications: E11.9

## 2021-11-30 MED ORDER — CETIRIZINE HCL 10 MG PO TABS: 10.0000 mg | ORAL_TABLET | Freq: Every day | ORAL | 0 refills | Status: AC

## 2021-11-30 MED ORDER — FLUTICASONE PROPIONATE 50 MCG/ACT NA SUSP: 2.0000 | Freq: Every day | NASAL | 0 refills | Status: DC

## 2021-11-30 MED ORDER — PSEUDOEPH-BROMPHEN-DM 30-2-10 MG/5ML PO SYRP: 5.0000 mL | ORAL_SOLUTION | Freq: Four times a day (QID) | ORAL | 0 refills | Status: DC | PRN

## 2021-11-30 NOTE — Discharge Instructions (Addendum)
Take medication as prescribed. ?Increase fluids and get plenty of rest. ?May use over-the-counter normal saline nasal spray. ?May use a humidifier at bedtime to help with cough and nasal congestion.  Also recommending sleeping elevated on 2 pillows. ?Follow-up if your symptoms worsen to include fever, chills, worsening cough, or other concerns. ?

## 2021-11-30 NOTE — ED Provider Notes (Signed)
?[facility] ? ? ? ?CSN: [id number] ?Arrival date & time: 11/30/21  8101 ? ? ?  ? ?History   ?Chief Complaint ?Chief Complaint  ?Patient presents with  ? Nasal Congestion  ? ? ?HPI ?HAYZE GAZDA is a 46 y.o. male.  ? ?The patient is a 46 year old male who presents for upper respiratory symptoms.  Symptoms started in the past 4 to 5 days.  Patient states that he noticed runny nose about 2 to 3 days prior.  States that he mowed his yard, and noticed the symptoms starting to worsen.  States that he also recently returned from out of town and noticed worsening symptoms.  Today he complains of continued nasal congestion, cough, runny nose, and intermittent sore throat.  He states that the cough is persistent, and does not change throughout the day.  He also admits to intermittent wheezing.  He denies fever, chills, headache, shortness of breath, or GI symptoms.  Patient states he did try diabetic Teston and Nasonex without relief.  Patient admits that he has a history of seasonal allergies, diabetes, and hypertension. ? ?The history is provided by the patient.  ? ?Past Medical History:  ?Diagnosis Date  ? Diabetes mellitus without complication (HCC)   ? ? ?Patient Active Problem List  ? Diagnosis Date Noted  ? BRONCHITIS, ACUTE 08/16/2008  ? OBESITY 11/21/2006  ? ESSENTIAL HYPERTENSION 11/21/2006  ? ALLERGIC RHINITIS 11/21/2006  ? PRURITUS ANI 11/21/2006  ? ? ?History reviewed. No pertinent surgical history. ? ? ? ? ?Home Medications   ? ?Prior to Admission medications   ?Medication Sig Start Date End Date Taking? Authorizing Provider  ?diltiazem (TIAZAC) 360 MG 24 hr capsule Take 360 mg by mouth daily. 07/21/19   [provider]  ?furosemide (LASIX) 20 MG tablet Take 20 mg by mouth daily. 08/11/19   [provider]  ?ibuprofen (ADVIL) 800 MG tablet  08/09/19   [provider]  ?ipratropium (ATROVENT) 0.03 % nasal spray Place 2 sprays into both nostrils 3 (three) times daily as needed  for rhinitis. 12/18/20   Bing Neighbors, FNP  ?promethazine-dextromethorphan (PROMETHAZINE-DM) 6.25-15 MG/5ML syrup Take 5 mLs by mouth 3 (three) times daily as needed for cough. 12/18/20   Bing Neighbors, FNP  ?spironolactone (ALDACTONE) 25 MG tablet Take 25 mg by mouth 2 (two) times daily. 07/19/19   [provider]  ? ? ?Family History ?Family History  ?Problem Relation Age of Onset  ? Cancer Mother   ?     breast   ? High blood pressure Father   ? Parkinson's disease Father   ? ? ?Social History ?Social History  ? ?Tobacco Use  ? Smoking status: Never  ? Smokeless tobacco: Never  ?Substance Use Topics  ? Alcohol use: Never  ? Drug use: Never  ? ? ? ?Allergies   ?Patient has no known allergies. ? ? ?Review of Systems ?Review of Systems  ?Constitutional:  Positive for fatigue. Negative for activity change, appetite change and fever.  ?HENT:  Positive for congestion, postnasal drip, rhinorrhea and sore throat. Negative for ear pain.   ?Eyes: Negative.   ?Respiratory:  Positive for cough and wheezing. Negative for shortness of breath.   ?Cardiovascular: Negative.   ?Gastrointestinal: Negative.   ?Skin: Negative.   ?Psychiatric/Behavioral: Negative.    ? ? ?Physical Exam ?Triage Vital Signs ?ED Triage Vitals  ?Enc Vitals Group  ?   BP 11/30/21 0925 124/78  ?   Pulse Rate 11/30/21 0925  72  ?   Resp 11/30/21 0925 17  ?   Temp 11/30/21 0925 98 ?F (36.7 ?C)  ?   Temp Source 11/30/21 0925 Oral  ?   SpO2 11/30/21 0925 96 %  ?   Weight 11/30/21 0940 280 lb (127 kg)  ?   Height 11/30/21 0940 5\' 6"  (1.676 m)  ?   Head Circumference --   ?   Peak Flow --   ?   Pain Score 11/30/21 0939 5  ?   Pain Loc --   ?   Pain Edu? --   ?   Excl. in [place]? --   ? ?No data found. ? ?Updated Vital Signs ?BP 124/78 (BP Location: Right Arm)   Pulse 72   Temp 98 ?F (36.7 ?C) (Oral)   Resp 17   Ht 5\' 6"  (1.676 m)   Wt 280 lb (127 kg)   SpO2 96%   BMI 45.19 kg/m?  ? ?Visual Acuity ?Right Eye Distance:   ?Left Eye Distance:    ?Bilateral Distance:   ? ?Right Eye Near:   ?Left Eye Near:    ?Bilateral Near:    ? ?Physical Exam ?Vitals reviewed.  ?Constitutional:   ?   General: He is not in acute distress. ?   Appearance: Normal appearance.  ?HENT:  ?   Head: Normocephalic and atraumatic.  ?   Right Ear: Tympanic membrane, ear canal and external ear normal.  ?   Left Ear: Tympanic membrane, ear canal and external ear normal.  ?   Nose: Congestion and rhinorrhea present.  ?   Mouth/Throat:  ?   Mouth: Mucous membranes are moist.  ?   Pharynx: Posterior oropharyngeal erythema present. No oropharyngeal exudate.  ?Eyes:  ?   Extraocular Movements: Extraocular movements intact.  ?   Conjunctiva/sclera: Conjunctivae normal.  ?   Pupils: Pupils are equal, round, and reactive to light.  ?Cardiovascular:  ?   Rate and Rhythm: Normal rate and regular rhythm.  ?   Pulses: Normal pulses.  ?   Heart sounds: Normal heart sounds.  ?Pulmonary:  ?   Effort: Pulmonary effort is normal. No respiratory distress.  ?   Breath sounds: Normal breath sounds. No wheezing or rales.  ?Abdominal:  ?   General: Bowel sounds are normal.  ?   Palpations: Abdomen is soft.  ?   Tenderness: There is no abdominal tenderness.  ?Musculoskeletal:  ?   Cervical back: Normal range of motion.  ?Lymphadenopathy:  ?   Cervical: No cervical adenopathy.  ?Skin: ?   General: Skin is warm and dry.  ?Neurological:  ?   General: No focal deficit present.  ?   Mental Status: He is alert and oriented to person, place, and time.  ?Psychiatric:     ?   Mood and Affect: Mood normal.     ?   Behavior: Behavior normal.  ? ? ? ?UC Treatments / Results  ?Labs ?(all labs ordered are listed, but only abnormal results are displayed) ?Labs Reviewed - No data to display ? ?EKG ? ? ?Radiology ?No results found. ? ?Procedures ?Procedures (including critical care time) ? ?Medications Ordered in UC ?Medications - No data to display ? ?Initial Impression / Assessment and Plan / UC Course  ?I have reviewed  the triage vital signs and the nursing notes. ? ?Pertinent labs & imaging results that were available during my care of the patient were reviewed by me and considered in my medical  decision making (see chart for details). ? ?The patient is a 46 year old male who presents for upper respiratory symptoms.  Symptoms have been present for the past 4 to 5 days.  Patient has a history of seasonal allergies.  Patient complains of nasal congestion, cough, runny nose.  He denies fever, chills, or other symptoms that may indicate a bacterial infection.  Patient reports that he mowed the yard prior to the onset of his symptoms and given his history of seasonal allergies, symptoms are consistent with allergic rhinitis.  Patient denies concern for COVID or flu exposure at this time.  We will forego testing today as symptoms are consistent with allergic rhinitis based on his history and presentation.  We will treat the patient symptomatically to include Bromfed, cetirizine, and Flonase.  Patient was advised to follow-up in the next 5 to 7 days if his symptoms worsen to include fever, chills, worsening cough, or symptoms or not improving. ?Final Clinical Impressions(s) / UC Diagnoses  ? ?Final diagnoses:  ?None  ? ?Discharge Instructions   ?None ?  ? ?ED Prescriptions   ?None ?  ? ?PDMP not reviewed this encounter. ?  ?Abran CantorLeath-Warren, Mahesh Sizemore J, NP ?11/30/21 1009 ? ?

## 2021-11-30 NOTE — ED Triage Notes (Addendum)
Pt reports nasal congestion,sinus drainage, facial pressure, intermittent cough with clear phlegm  x1 week. Pt reports has been using otc medication and humidifiers but reports symptoms persist.  ?

## 2022-09-26 ENCOUNTER — Ambulatory Visit
Admission: EM | Admit: 2022-09-26 | Discharge: 2022-09-26 | Disposition: A | Discharge status: Home / Self Care | Payer: BC Managed Care – PPO | Attending: Nurse Practitioner | Admitting: Nurse Practitioner

## 2022-09-26 ENCOUNTER — Encounter: Payer: Self-pay | Admitting: Emergency Medicine

## 2022-09-26 DIAGNOSIS — Z1152 Encounter for screening for COVID-19: Secondary | ICD-10-CM

## 2022-09-26 DIAGNOSIS — J309 Allergic rhinitis, unspecified: Secondary | ICD-10-CM

## 2022-09-26 HISTORY — DX: Essential (primary) hypertension: I10

## 2022-09-26 MED ORDER — FLUTICASONE PROPIONATE 50 MCG/ACT NA SUSP: 2.0000 | Freq: Every day | NASAL | 0 refills | Status: AC

## 2022-09-26 MED ORDER — CETIRIZINE-PSEUDOEPHEDRINE ER 5-120 MG PO TB12: 1.0000 | ORAL_TABLET | Freq: Every day | ORAL | 0 refills | Status: DC

## 2022-09-26 NOTE — ED Triage Notes (Signed)
Runny nose x 3 days.  Has been using flonase.

## 2022-09-26 NOTE — Discharge Instructions (Addendum)
COVID test is pending.  You will be contacted if the results are positive.  As discussed, you are a candidate to receive Paxlovid.  If you begin this medication, please make sure that you hold your cholesterol medication for 2 weeks. Take medication as directed. Increase fluids and get plenty of rest. May take over-the-counter ibuprofen or Tylenol as needed for pain, fever, or general discomfort. Recommend normal saline nasal spray to help with nasal congestion throughout the day. Recommend use of a humidifier in your home to help with nasal congestion. As discussed, if symptoms worsen within the next 7 to 10 days, or do not improve before that time, please follow-up in this clinic or with your primary care physician for further evaluation. Follow-up as needed.

## 2022-09-26 NOTE — ED Provider Notes (Signed)
[facility]    CSN: [id number] Arrival date & time: 09/26/22  1638      History   Chief Complaint No chief complaint on file.   HPI Jeremiah Garner is a 47 y.o. male.   The history is provided by the patient.   Patient presents with a 3-day history of runny nose.  He denies fever, chills, headache, sore throat, nasal congestion, cough, abdominal pain, nausea, vomiting, or diarrhea.  Patient states he has been using over-the-counter nasal spray for his symptoms.  He states that he does have a history of seasonal allergies, but states that he normally does not have issues around this time of the year.  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 Past Medical History:  Diagnosis Date   Diabetes mellitus without complication ([place])    Hypertension     Patient Active Problem List   Diagnosis Date Noted   BRONCHITIS, ACUTE 08/16/2008   OBESITY 11/21/2006   ESSENTIAL HYPERTENSION 11/21/2006   ALLERGIC RHINITIS 11/21/2006   PRURITUS ANI 11/21/2006    History reviewed. No pertinent surgical history.     Home Medications    Prior to Admission medications   Medication Sig Start Date End Date Taking? Authorizing Provider  cetirizine-pseudoephedrine (ZYRTEC-D) 5-120 MG tablet Take 1 tablet by mouth daily. 09/26/22  Yes Maxemiliano Riel-Warren, Alda Lea, NP  fluticasone (FLONASE) 50 MCG/ACT nasal spray Place 2 sprays into both nostrils daily. 09/26/22  Yes Lenice Koper-Warren, Alda Lea, NP  lisinopril (ZESTRIL) 2.5 MG tablet Take 2.5 mg by mouth daily.   Yes [provider]  metformin (FORTAMET) 500 MG (OSM) 24 hr tablet Take 500 mg by mouth daily with breakfast.   Yes [provider]  brompheniramine-pseudoephedrine-DM 30-2-10 MG/5ML syrup Take 5 mLs by mouth 4 (four) times daily as needed. 11/30/21   Raesha Coonrod-Warren, Alda Lea, NP  cetirizine (ZYRTEC) 10 MG tablet Take 1 tablet (10 mg total) by mouth  daily. 11/30/21   Levan Aloia-Warren, Alda Lea, NP  diltiazem (TIAZAC) 360 MG 24 hr capsule Take 360 mg by mouth daily. 07/21/19   [provider]  furosemide (LASIX) 20 MG tablet Take 20 mg by mouth daily. 08/11/19   [provider]  ibuprofen (ADVIL) 800 MG tablet  08/09/19   [provider]  ipratropium (ATROVENT) 0.03 % nasal spray Place 2 sprays into both nostrils 3 (three) times daily as needed for rhinitis. 12/18/20   Scot Jun, NP  promethazine-dextromethorphan (PROMETHAZINE-DM) 6.25-15 MG/5ML syrup Take 5 mLs by mouth 3 (three) times daily as needed for cough. 12/18/20   Scot Jun, NP  spironolactone (ALDACTONE) 25 MG tablet Take 25 mg by mouth 2 (two) times daily. 07/19/19   [provider]    Family History Family History  Problem Relation Age of Onset   Cancer Mother        breast    High blood pressure Father    Parkinson's disease Father     Social History Social History   Tobacco Use   Smoking status: Never   Smokeless tobacco: Never  Vaping Use   Vaping Use: Never used  Substance Use Topics   Alcohol use: Never   Drug use: Never     Allergies   Patient has no known allergies.   Review of Systems Review of Systems Per HPI  Physical Exam Triage Vital Signs ED Triage Vitals  Enc Vitals Group     BP  09/26/22 1647 137/81     Pulse Rate 09/26/22 1647 87     Resp 09/26/22 1647 18     Temp 09/26/22 1647 98.3 F (36.8 C)     Temp Source 09/26/22 1647 Oral     SpO2 09/26/22 1647 94 %     Weight --      Height --      Head Circumference --      Peak Flow --      Pain Score 09/26/22 1648 0     Pain Loc --      Pain Edu? --      Excl. in [place]? --    No data found.  Updated Vital Signs BP 137/81 (BP Location: Right Arm)   Pulse 87   Temp 98.3 F (36.8 C) (Oral)   Resp 18   SpO2 94%   Visual Acuity Right Eye Distance:   Left Eye Distance:   Bilateral Distance:    Right Eye Near:   Left Eye Near:     Bilateral Near:     Physical Exam Vitals and nursing note reviewed.  Constitutional:      General: He is not in acute distress.    Appearance: Normal appearance.  HENT:     Head: Normocephalic.     Right Ear: Tympanic membrane, ear canal and external ear normal.     Left Ear: Tympanic membrane, ear canal and external ear normal.     Nose: Rhinorrhea present. Rhinorrhea is clear.     Right Turbinates: Enlarged and swollen.     Left Turbinates: Enlarged and swollen.     Right Sinus: No maxillary sinus tenderness or frontal sinus tenderness.     Left Sinus: No maxillary sinus tenderness or frontal sinus tenderness.     Mouth/Throat:     Mouth: Mucous membranes are moist.     Pharynx: Posterior oropharyngeal erythema present.     Comments: Cobblestoning present on posterior oropharynx Eyes:     Extraocular Movements: Extraocular movements intact.     Conjunctiva/sclera: Conjunctivae normal.     Pupils: Pupils are equal, round, and reactive to light.  Cardiovascular:     Rate and Rhythm: Normal rate and regular rhythm.     Pulses: Normal pulses.     Heart sounds: Normal heart sounds.  Pulmonary:     Effort: Pulmonary effort is normal.     Breath sounds: Normal breath sounds.  Abdominal:     General: Bowel sounds are normal.     Palpations: Abdomen is soft.     Tenderness: There is no abdominal tenderness.  Musculoskeletal:     Cervical back: Normal range of motion.  Skin:    General: Skin is warm and dry.  Neurological:     General: No focal deficit present.     Mental Status: He is alert and oriented to person, place, and time.  Psychiatric:        Mood and Affect: Mood normal.        Behavior: Behavior normal.      UC Treatments / Results  Labs (all labs ordered are listed, but only abnormal results are displayed) Labs Reviewed  SARS CORONAVIRUS 2 (TAT 6-24 HRS)    EKG   Radiology No results found.  Procedures Procedures (including critical care  time)  Medications Ordered in UC Medications - No data to display  Initial Impression / Assessment and Plan / UC Course  I have reviewed the triage vital signs and the  nursing notes.  Pertinent labs & imaging results that were available during my care of the patient were reviewed by me and considered in my medical decision making (see chart for details).  The patient is well-appearing, he is in no acute distress, vital signs are stable.  COVID test is pending.  Patient is a candidate to receive Paxlovid if his test is positive.  He was also advised to hold his cholesterol medication if he begins Paxlovid.  Suspect allergic rhinitis given the patient's current symptoms.  Will treat with Zyrtec D 5/120 mg tablets twice daily, and fluticasone 50 mcg nasal spray.  Supportive care recommendations were provided to the patient along with strict indications of when follow-up may be necessary.  The patient Is in agreement with this plan of care.  Patient verbalizes understanding.  All questions were answered.  Patient stable for discharge.   Final Clinical Impressions(s) / UC Diagnoses   Final diagnoses:  Allergic rhinitis, unspecified seasonality, unspecified trigger  Encounter for screening for COVID-19     Discharge Instructions      COVID test is pending.  You will be contacted if the results are positive.  As discussed, you are a candidate to receive Paxlovid.  If you begin this medication, please make sure that you hold your cholesterol medication for 2 weeks. Take medication as directed. Increase fluids and get plenty of rest. May take over-the-counter ibuprofen or Tylenol as needed for pain, fever, or general discomfort. Recommend normal saline nasal spray to help with nasal congestion throughout the day. Recommend use of a humidifier in your home to help with nasal congestion. As discussed, if symptoms worsen within the next 7 to 10 days, or do not improve before that time, please  follow-up in this clinic or with your primary care physician for further evaluation. Follow-up as needed.      ED Prescriptions     Medication Sig Dispense Auth. Provider   fluticasone (FLONASE) 50 MCG/ACT nasal spray Place 2 sprays into both nostrils daily. 16 g Elianne Gubser-Warren, Alda Lea, NP   cetirizine-pseudoephedrine (ZYRTEC-D) 5-120 MG tablet Take 1 tablet by mouth daily. 60 tablet Mitchel Delduca-Warren, Alda Lea, NP      PDMP not reviewed this encounter.   Tish Men, NP 09/26/22 1705

## 2022-09-27 ENCOUNTER — Telehealth (HOSPITAL_COMMUNITY): Payer: Self-pay | Admitting: Emergency Medicine

## 2022-09-27 LAB — SARS CORONAVIRUS 2 (TAT 6-24 HRS): SARS Coronavirus 2: POSITIVE — AB

## 2022-09-27 MED ORDER — NIRMATRELVIR/RITONAVIR (PAXLOVID)TABLET: 3.0000 | ORAL_TABLET | Freq: Two times a day (BID) | ORAL | 0 refills | Status: AC

## 2022-10-03 ENCOUNTER — Encounter: Payer: Self-pay | Admitting: Radiology

## 2022-10-14 LAB — LAB REPORT - SCANNED
EGFR: 106
PSA, Total: 0.45

## 2022-10-27 NOTE — H&P (View-Only) (Signed)
Referring Provider: Iona Beard, MD Primary Care Physician:  Iona Beard, MD Primary GI Physician: Dr. Gala Romney  Chief Complaint  Patient presents with   Rectal Bleeding    Has not noticed any bleeding.     HPI:   Jeremiah Garner is a 47 y.o. male presenting today with a history of Iona Beard, MD for rectal bleeding.   Reviewed office visit with PCP dated 10/14/2022.  Patient denies any significant GI symptoms.  On rectal exam, he was found to be heme positive.  Blood work completed same day of appointment revealed hemoglobin of 16.1.  Recommended seeing GI for further evaluation of heme positive stool.  Today: Denies any significant GI symptoms.  Reports he is never seen any BRBPR or melena.  No constipation, diarrhea, abdominal pain, unintentional weight loss, nausea, vomiting, reflux symptoms, dysphagia.  Prior colonoscopy: Never Family history of colon cancer: None.      Past Medical History:  Diagnosis Date   Diabetes mellitus without complication ([place])    Hypertension     History reviewed. No pertinent surgical history.  Current Outpatient Medications  Medication Sig Dispense Refill   cetirizine (ZYRTEC) 10 MG tablet Take 1 tablet (10 mg total) by mouth daily. 30 tablet 0   diltiazem (TIAZAC) 360 MG 24 hr capsule Take 360 mg by mouth daily.     fluticasone (FLONASE) 50 MCG/ACT nasal spray Place 2 sprays into both nostrils daily. 16 g 0   metformin (FORTAMET) 500 MG (OSM) 24 hr tablet Take 500 mg by mouth daily with breakfast.     simvastatin (ZOCOR) 10 MG tablet Take 10 mg by mouth at bedtime.     spironolactone (ALDACTONE) 25 MG tablet Take 25 mg by mouth 2 (two) times daily.     ibuprofen (ADVIL) 800 MG tablet  (Patient not taking: Reported on 08/22/2020)     No current facility-administered medications for this visit.    Allergies as of 10/30/2022   (No Known Allergies)    Family History  Problem Relation Age of Onset   Cancer Mother        breast     High blood pressure Father    Parkinson's disease Father    Colonic polyp Neg Hx    Colon cancer Neg Hx     Social History   Socioeconomic History   Marital status: Married    Spouse name: Not on file   Number of children: Not on file   Years of education: Not on file   Highest education level: Not on file  Occupational History   Not on file  Tobacco Use   Smoking status: Never   Smokeless tobacco: Never  Vaping Use   Vaping Use: Never used  Substance and Sexual Activity   Alcohol use: Not Currently   Drug use: Never   Sexual activity: Not on file  Other Topics Concern   Not on file  Social History Narrative   Not on file   Social Determinants of Health   Financial Resource Strain: Not on file  Food Insecurity: Not on file  Transportation Needs: Not on file  Physical Activity: Not on file  Stress: Not on file  Social Connections: Not on file    Review of Systems: Gen: Denies fever, chills, cold or flulike symptoms, presyncope, syncope. CV: Denies chest pain, palpitations. Resp: Denies dyspnea, cough. GI: See HPI Derm: Denies rash, itching, dry skin Psych: Denies depression, anxiety.  Heme: See HPI  Physical Exam: BP  135/85 (BP Location: Left Arm, Patient Position: Sitting, Cuff Size: Large)   Pulse 81   Temp 97.7 F (36.5 C) (Temporal)   Ht 5\' 6"  (1.676 m)   Wt 280 lb 9.6 oz (127.3 kg)   SpO2 97%   BMI 45.29 kg/m  General:   Alert and oriented. No distress noted. Pleasant and cooperative.  Head:  Normocephalic and atraumatic. Eyes:  Conjuctiva clear without scleral icterus. Heart:  S1, S2 present without murmurs appreciated. Lungs:  Clear to auscultation bilaterally. No wheezes, rales, or rhonchi. No distress.  Abdomen:  +BS, soft, non-tender and non-distended. No rebound or guarding. No HSM or masses noted. Msk:  Symmetrical without gross deformities. Normal posture. Extremities:  Without edema. Neurologic:  Alert and  oriented x4 Psych:  Normal  mood and affect.    Assessment:  47 year old male with history of diabetes, HTN, sleep apnea, presenting today at the request of Dr. He will for heme positive stool.  Patient has never had a colonoscopy.  He denies BRBPR, melena, unintentional weight loss, or any other significant GI symptoms.  Hemoglobin was 16.1 on 10/14/2022.  No family history of colon cancer.  We will arrange a colonoscopy for him in the near future to further evaluate heme positive stool.   Plan:  Proceed with colonoscopy with propofol by Dr. Gala Romney in near future. The risks, benefits, and alternatives have been discussed with the patient in detail. The patient states understanding and desires to proceed.  ASA 3 Separate instructions provided for diabetes medication adjustments. Follow-up per Dr. Roseanne Kaufman recommendations.   Aliene Altes, PA-C [facility] 10/30/2022

## 2022-10-27 NOTE — Progress Notes (Unsigned)
Referring Provider: Iona Beard, MD Primary Care Physician:  Iona Beard, MD Primary GI Physician: Dr. Gala Romney  Chief Complaint  Patient presents with   Rectal Bleeding    Has not noticed any bleeding.     HPI:   Jeremiah Garner is a 47 y.o. male presenting today with a history of Iona Beard, MD for rectal bleeding.   Reviewed office visit with PCP dated 10/14/2022.  Patient denies any significant GI symptoms.  On rectal exam, he was found to be heme positive.  Blood work completed same day of appointment revealed hemoglobin of 16.1.  Recommended seeing GI for further evaluation of heme positive stool.  Today: Denies any significant GI symptoms.  Reports he is never seen any BRBPR or melena.  No constipation, diarrhea, abdominal pain, unintentional weight loss, nausea, vomiting, reflux symptoms, dysphagia.  Prior colonoscopy: Never Family history of colon cancer: None.      Past Medical History:  Diagnosis Date   Diabetes mellitus without complication ([place])    Hypertension     History reviewed. No pertinent surgical history.  Current Outpatient Medications  Medication Sig Dispense Refill   cetirizine (ZYRTEC) 10 MG tablet Take 1 tablet (10 mg total) by mouth daily. 30 tablet 0   diltiazem (TIAZAC) 360 MG 24 hr capsule Take 360 mg by mouth daily.     fluticasone (FLONASE) 50 MCG/ACT nasal spray Place 2 sprays into both nostrils daily. 16 g 0   metformin (FORTAMET) 500 MG (OSM) 24 hr tablet Take 500 mg by mouth daily with breakfast.     simvastatin (ZOCOR) 10 MG tablet Take 10 mg by mouth at bedtime.     spironolactone (ALDACTONE) 25 MG tablet Take 25 mg by mouth 2 (two) times daily.     ibuprofen (ADVIL) 800 MG tablet  (Patient not taking: Reported on 08/22/2020)     No current facility-administered medications for this visit.    Allergies as of 10/30/2022   (No Known Allergies)    Family History  Problem Relation Age of Onset   Cancer Mother        breast     High blood pressure Father    Parkinson's disease Father    Colonic polyp Neg Hx    Colon cancer Neg Hx     Social History   Socioeconomic History   Marital status: Married    Spouse name: Not on file   Number of children: Not on file   Years of education: Not on file   Highest education level: Not on file  Occupational History   Not on file  Tobacco Use   Smoking status: Never   Smokeless tobacco: Never  Vaping Use   Vaping Use: Never used  Substance and Sexual Activity   Alcohol use: Not Currently   Drug use: Never   Sexual activity: Not on file  Other Topics Concern   Not on file  Social History Narrative   Not on file   Social Determinants of Health   Financial Resource Strain: Not on file  Food Insecurity: Not on file  Transportation Needs: Not on file  Physical Activity: Not on file  Stress: Not on file  Social Connections: Not on file    Review of Systems: Gen: Denies fever, chills, cold or flulike symptoms, presyncope, syncope. CV: Denies chest pain, palpitations. Resp: Denies dyspnea, cough. GI: See HPI Derm: Denies rash, itching, dry skin Psych: Denies depression, anxiety.  Heme: See HPI  Physical Exam: BP  135/85 (BP Location: Left Arm, Patient Position: Sitting, Cuff Size: Large)   Pulse 81   Temp 97.7 F (36.5 C) (Temporal)   Ht 5\' 6"  (1.676 m)   Wt 280 lb 9.6 oz (127.3 kg)   SpO2 97%   BMI 45.29 kg/m  General:   Alert and oriented. No distress noted. Pleasant and cooperative.  Head:  Normocephalic and atraumatic. Eyes:  Conjuctiva clear without scleral icterus. Heart:  S1, S2 present without murmurs appreciated. Lungs:  Clear to auscultation bilaterally. No wheezes, rales, or rhonchi. No distress.  Abdomen:  +BS, soft, non-tender and non-distended. No rebound or guarding. No HSM or masses noted. Msk:  Symmetrical without gross deformities. Normal posture. Extremities:  Without edema. Neurologic:  Alert and  oriented x4 Psych:  Normal  mood and affect.    Assessment:  47 year old male with history of diabetes, HTN, sleep apnea, presenting today at the request of Dr. He will for heme positive stool.  Patient has never had a colonoscopy.  He denies BRBPR, melena, unintentional weight loss, or any other significant GI symptoms.  Hemoglobin was 16.1 on 10/14/2022.  No family history of colon cancer.  We will arrange a colonoscopy for him in the near future to further evaluate heme positive stool.   Plan:  Proceed with colonoscopy with propofol by Dr. Gala Romney in near future. The risks, benefits, and alternatives have been discussed with the patient in detail. The patient states understanding and desires to proceed.  ASA 3 Separate instructions provided for diabetes medication adjustments. Follow-up per Dr. Roseanne Kaufman recommendations.   Aliene Altes, PA-C [facility] 10/30/2022

## 2022-10-30 ENCOUNTER — Encounter: Payer: Self-pay | Admitting: *Deleted

## 2022-10-30 ENCOUNTER — Ambulatory Visit: Payer: BC Managed Care – PPO | Admitting: Gastroenterology

## 2022-10-30 ENCOUNTER — Other Ambulatory Visit: Payer: Self-pay | Admitting: *Deleted

## 2022-10-30 ENCOUNTER — Telehealth: Payer: Self-pay | Admitting: *Deleted

## 2022-10-30 ENCOUNTER — Encounter: Payer: Self-pay | Admitting: Gastroenterology

## 2022-10-30 VITALS — BP 135/85 | HR 81 | Temp 97.7°F | Ht 66.0 in | Wt 280.6 lb

## 2022-10-30 DIAGNOSIS — R195 Other fecal abnormalities: Secondary | ICD-10-CM | POA: Diagnosis not present

## 2022-10-30 MED ORDER — PEG 3350-KCL-NA BICARB-NACL 420 G PO SOLR: 4000.0000 mL | Freq: Once | ORAL | 0 refills | Status: AC

## 2022-10-30 NOTE — Telephone Encounter (Signed)
Left detailed vm (with pt identifying himself) about pre-op date on 10/27/22 at 3:15 pm at [facility]. Any questions, to please call back.

## 2022-10-30 NOTE — Patient Instructions (Addendum)
Please arrange to have a colonoscopy in the near future with Dr. Gala Romney. 1 day prior to your procedure: You can take your metformin as prescribed. Day of your procedure: Do not take any morning diabetes medications.  Will follow-up with you in the office as Dr. Gala Romney recommends.  It was very nice to meet you today!   Aliene Altes, PA-C [facility]

## 2022-11-26 NOTE — Patient Instructions (Signed)
Your procedure is scheduled on: 11/29/2022  Report to [facility] Main Entrance at     12:15 PM.  Call this number if you have problems the morning of surgery: [phone]   Remember:              Follow Directions on the letter you received from Your Physician's office regarding the Bowel Prep              No Smoking the day of Procedure :   Take these medicines the morning of surgery with A SIP OF WATER: Diltiazem  Zyrtec and flonase if needed   Do not wear jewelry, make-up or nail polish.    Do not bring valuables to the hospital.  Contacts, dentures or bridgework may not be worn into surgery.  .   Patients discharged the day of surgery will not be allowed to drive home.     Colonoscopy, Adult, Care After This sheet gives you information about how to care for yourself after your procedure. Your health care provider may also give you more specific instructions. If you have problems or questions, contact your health care provider. What can I expect after the procedure? After the procedure, it is common to have: A small amount of blood in your stool for 24 hours after the procedure. Some gas. Mild abdominal cramping or bloating.  Follow these instructions at home: General instructions  For the first 24 hours after the procedure: Do not drive or use machinery. Do not sign important documents. Do not drink alcohol. Do your regular daily activities at a slower pace than normal. Eat soft, easy-to-digest foods. Rest often. Take over-the-counter or prescription medicines only as told by your health care provider. It is up to you to get the results of your procedure. Ask your health care provider, or the department performing the procedure, when your results will be ready. Relieving cramping and bloating Try walking around when you have cramps or feel bloated. Apply heat to your abdomen as told by your health care provider. Use a heat source that your health care provider  recommends, such as a moist heat pack or a heating pad. Place a towel between your skin and the heat source. Leave the heat on for 20-30 minutes. Remove the heat if your skin turns bright red. This is especially important if you are unable to feel pain, heat, or cold. You may have a greater risk of getting burned. Eating and drinking Drink enough fluid to keep your urine clear or pale yellow. Resume your normal diet as instructed by your health care provider. Avoid heavy or fried foods that are hard to digest. Avoid drinking alcohol for as long as instructed by your health care provider. Contact a health care provider if: You have blood in your stool 2-3 days after the procedure. Get help right away if: You have more than a small spotting of blood in your stool. You pass large blood clots in your stool. Your abdomen is swollen. You have nausea or vomiting. You have a fever. You have increasing abdominal pain that is not relieved with medicine. This information is not intended to replace advice given to you by your health care provider. Make sure you discuss any questions you have with your health care provider. Document Released: 03/05/2004 Document Revised: 04/15/2016 Document Reviewed: 10/03/2015 Elsevier Interactive Patient Education  2018 [employer].  Your procedure is scheduled on:   Report to [facility] at  AM.  Call this number if you have problems the morning of surgery: [phone]   Remember:              Follow Directions on the letter you received from Your Physician's office regarding the Bowel Prep              No Smoking the day of Procedure :   Take these medicines the morning of surgery with A SIP OF WATER:    Do not wear jewelry, make-up or nail polish.    Do not bring valuables to the hospital.  Contacts, dentures or bridgework may not be worn into surgery.  .   Patients discharged the day of surgery will not be allowed to drive  home.     Colonoscopy, Adult, Care After This sheet gives you information about how to care for yourself after your procedure. Your health care provider may also give you more specific instructions. If you have problems or questions, contact your health care provider. What can I expect after the procedure? After the procedure, it is common to have: A small amount of blood in your stool for 24 hours after the procedure. Some gas. Mild abdominal cramping or bloating.  Follow these instructions at home: General instructions  For the first 24 hours after the procedure: Do not drive or use machinery. Do not sign important documents. Do not drink alcohol. Do your regular daily activities at a slower pace than normal. Eat soft, easy-to-digest foods. Rest often. Take over-the-counter or prescription medicines only as told by your health care provider. It is up to you to get the results of your procedure. Ask your health care provider, or the department performing the procedure, when your results will be ready. Relieving cramping and bloating Try walking around when you have cramps or feel bloated. Apply heat to your abdomen as told by your health care provider. Use a heat source that your health care provider recommends, such as a moist heat pack or a heating pad. Place a towel between your skin and the heat source. Leave the heat on for 20-30 minutes. Remove the heat if your skin turns bright red. This is especially important if you are unable to feel pain, heat, or cold. You may have a greater risk of getting burned. Eating and drinking Drink enough fluid to keep your urine clear or pale yellow. Resume your normal diet as instructed by your health care provider. Avoid heavy or fried foods that are hard to digest. Avoid drinking alcohol for as long as instructed by your health care provider. Contact a health care provider if: You have blood in your stool 2-3 days after the procedure. Get  help right away if: You have more than a small spotting of blood in your stool. You pass large blood clots in your stool. Your abdomen is swollen. You have nausea or vomiting. You have a fever. You have increasing abdominal pain that is not relieved with medicine. This information is not intended to replace advice given to you by your health care provider. Make sure you discuss any questions you have with your health care provider. Document Released: 03/05/2004 Document Revised: 04/15/2016 Document Reviewed: 10/03/2015 Elsevier Interactive Patient Education  [employer].

## 2022-11-27 ENCOUNTER — Encounter (HOSPITAL_COMMUNITY)
Admission: RE | Admit: 2022-11-27 | Discharge: 2022-11-27 | Disposition: A | Discharge status: Home / Self Care | Payer: BC Managed Care – PPO | Source: Ambulatory Visit | Attending: Internal Medicine | Admitting: Internal Medicine

## 2022-11-27 ENCOUNTER — Encounter (HOSPITAL_COMMUNITY): Payer: Self-pay

## 2022-11-27 DIAGNOSIS — I10 Essential (primary) hypertension: Secondary | ICD-10-CM

## 2022-11-27 DIAGNOSIS — Z79899 Other long term (current) drug therapy: Secondary | ICD-10-CM | POA: Diagnosis not present

## 2022-11-27 DIAGNOSIS — Z6841 Body Mass Index (BMI) 40.0 and over, adult: Secondary | ICD-10-CM | POA: Diagnosis not present

## 2022-11-27 DIAGNOSIS — I452 Bifascicular block: Secondary | ICD-10-CM | POA: Insufficient documentation

## 2022-11-27 DIAGNOSIS — I498 Other specified cardiac arrhythmias: Secondary | ICD-10-CM | POA: Insufficient documentation

## 2022-11-27 DIAGNOSIS — G4730 Sleep apnea, unspecified: Secondary | ICD-10-CM | POA: Diagnosis not present

## 2022-11-27 DIAGNOSIS — Z7984 Long term (current) use of oral hypoglycemic drugs: Secondary | ICD-10-CM | POA: Diagnosis not present

## 2022-11-27 DIAGNOSIS — Z01818 Encounter for other preprocedural examination: Secondary | ICD-10-CM | POA: Insufficient documentation

## 2022-11-27 DIAGNOSIS — K640 First degree hemorrhoids: Secondary | ICD-10-CM | POA: Diagnosis not present

## 2022-11-27 DIAGNOSIS — E119 Type 2 diabetes mellitus without complications: Secondary | ICD-10-CM | POA: Diagnosis not present

## 2022-11-27 DIAGNOSIS — R195 Other fecal abnormalities: Secondary | ICD-10-CM | POA: Diagnosis present

## 2022-11-29 ENCOUNTER — Ambulatory Visit (HOSPITAL_COMMUNITY): Payer: BC Managed Care – PPO | Admitting: Anesthesiology

## 2022-11-29 ENCOUNTER — Ambulatory Visit (HOSPITAL_COMMUNITY)
Admission: RE | Admit: 2022-11-29 | Discharge: 2022-11-29 | Disposition: A | Discharge status: Home / Self Care | Payer: BC Managed Care – PPO | Attending: Internal Medicine | Admitting: Internal Medicine

## 2022-11-29 ENCOUNTER — Encounter (HOSPITAL_COMMUNITY): Payer: Self-pay | Admitting: Internal Medicine

## 2022-11-29 ENCOUNTER — Encounter (HOSPITAL_COMMUNITY)
Admission: RE | Disposition: A | Discharge status: Home / Self Care | Payer: Self-pay | Source: Home / Self Care | Attending: Internal Medicine

## 2022-11-29 DIAGNOSIS — G4730 Sleep apnea, unspecified: Secondary | ICD-10-CM | POA: Insufficient documentation

## 2022-11-29 DIAGNOSIS — Z79899 Other long term (current) drug therapy: Secondary | ICD-10-CM | POA: Insufficient documentation

## 2022-11-29 DIAGNOSIS — R195 Other fecal abnormalities: Secondary | ICD-10-CM | POA: Insufficient documentation

## 2022-11-29 DIAGNOSIS — K640 First degree hemorrhoids: Secondary | ICD-10-CM | POA: Diagnosis not present

## 2022-11-29 DIAGNOSIS — Z7984 Long term (current) use of oral hypoglycemic drugs: Secondary | ICD-10-CM | POA: Insufficient documentation

## 2022-11-29 DIAGNOSIS — Z6841 Body Mass Index (BMI) 40.0 and over, adult: Secondary | ICD-10-CM | POA: Insufficient documentation

## 2022-11-29 DIAGNOSIS — I10 Essential (primary) hypertension: Secondary | ICD-10-CM | POA: Insufficient documentation

## 2022-11-29 DIAGNOSIS — E119 Type 2 diabetes mellitus without complications: Secondary | ICD-10-CM | POA: Insufficient documentation

## 2022-11-29 HISTORY — PX: COLONOSCOPY WITH PROPOFOL: SHX5780

## 2022-11-29 LAB — GLUCOSE, CAPILLARY: Glucose-Capillary: 148 mg/dL — ABNORMAL HIGH (ref 70–99)

## 2022-11-29 SURGERY — COLONOSCOPY WITH PROPOFOL
Anesthesia: General

## 2022-11-29 MED ORDER — PROPOFOL 500 MG/50ML IV EMUL
INTRAVENOUS | Status: DC | PRN
  Administered 2022-11-29: 150 ug/kg/min via INTRAVENOUS

## 2022-11-29 MED ORDER — PROPOFOL 10 MG/ML IV BOLUS
INTRAVENOUS | Status: DC | PRN
  Administered 2022-11-29: 100 mg via INTRAVENOUS
  Administered 2022-11-29: 20 mg via INTRAVENOUS
  Administered 2022-11-29: 30 mg via INTRAVENOUS

## 2022-11-29 MED ORDER — LACTATED RINGERS IV SOLN: INTRAVENOUS | Status: DC | PRN

## 2022-11-29 NOTE — Interval H&P Note (Signed)
History and Physical Interval Note:  11/29/2022 2:34 PM  ELZIE KNISLEY  has presented today for surgery, with the diagnosis of heme positive stool.  The various methods of treatment have been discussed with the patient and family. After consideration of risks, benefits and other options for treatment, the patient has consented to  Procedure(s) with comments: COLONOSCOPY WITH PROPOFOL (N/A) - 2:15 pm as a surgical intervention.  The patient's history has been reviewed, patient examined, no change in status, stable for surgery.  I have reviewed the patient's chart and labs.  Questions were answered to the patient's satisfaction.     Cathi Hazan    No change.  Patient was Hemoccult positive on DRE.  Otherwise, has never had any bowel symptoms.  Diagnostic colonoscopy today per plan.  The risks, benefits, limitations, alternatives and imponderables have been reviewed with the patient. Questions have been answered. All parties are agreeable.

## 2022-11-29 NOTE — Anesthesia Preprocedure Evaluation (Signed)
Anesthesia Evaluation  Patient identified by MRN, date of birth, ID band Patient awake    Reviewed: Allergy & Precautions, H&P , NPO status , Patient's Chart, lab work & pertinent test results  Airway Mallampati: III  TM Distance: >3 FB Neck ROM: Full    Dental  (+) Dental Advisory Given, Teeth Intact   Pulmonary sleep apnea and Continuous Positive Airway Pressure Ventilation    Pulmonary exam normal breath sounds clear to auscultation       Cardiovascular hypertension, Pt. on medications Normal cardiovascular exam Rhythm:Regular Rate:Normal     Neuro/Psych negative neurological ROS  negative psych ROS   GI/Hepatic negative GI ROS, Neg liver ROS,,,  Endo/Other  diabetes, Well Controlled, Type 2, Oral Hypoglycemic Agents  Morbid obesity  Renal/GU negative Renal ROS  negative genitourinary   Musculoskeletal negative musculoskeletal ROS (+)    Abdominal   Peds negative pediatric ROS (+)  Hematology negative hematology ROS (+)   Anesthesia Other Findings   Reproductive/Obstetrics negative OB ROS                             Anesthesia Physical Anesthesia Plan  ASA: 3  Anesthesia Plan: General   Post-op Pain Management: Minimal or no pain anticipated   Induction: Intravenous  PONV Risk Score and Plan: Treatment may vary due to age or medical condition  Airway Management Planned: Nasal Cannula and Natural Airway  Additional Equipment:   Intra-op Plan:   Post-operative Plan:   Informed Consent: I have reviewed the patients History and Physical, chart, labs and discussed the procedure including the risks, benefits and alternatives for the proposed anesthesia with the patient or authorized representative who has indicated his/her understanding and acceptance.     Dental advisory given  Plan Discussed with: CRNA and Surgeon  Anesthesia Plan Comments:        Anesthesia Quick  Evaluation

## 2022-11-29 NOTE — Anesthesia Postprocedure Evaluation (Signed)
Anesthesia Post Note  Patient: Jeremiah Garner  Procedure(s) Performed: COLONOSCOPY WITH PROPOFOL  Patient location during evaluation: Phase II Anesthesia Type: General Level of consciousness: awake and alert and oriented Pain management: pain level controlled Vital Signs Assessment: post-procedure vital signs reviewed and stable Respiratory status: spontaneous breathing, nonlabored ventilation and respiratory function stable Cardiovascular status: blood pressure returned to baseline and stable Postop Assessment: no apparent nausea or vomiting Anesthetic complications: no  No notable events documented.   Last Vitals:  Vitals:   11/29/22 1247 11/29/22 1503  BP: (!) 141/78 (!) 92/56  Pulse:  73  Resp:  (!) 26  Temp:  36.5 C  SpO2:  92%    Last Pain:  Vitals:   11/29/22 1503  TempSrc: Oral  PainSc: 0-No pain                 Gertrude Bucks C Emalina Dubreuil

## 2022-11-29 NOTE — Discharge Instructions (Signed)
  Colonoscopy Discharge Instructions  Read the instructions outlined below and refer to this sheet in the next few weeks. These discharge instructions provide you with general information on caring for yourself after you leave the hospital. Your doctor may also give you specific instructions. While your treatment has been planned according to the most current medical practices available, unavoidable complications occasionally occur. If you have any problems or questions after discharge, call Dr. Jena Gauss at [phone]. ACTIVITY You may resume your regular activity, but move at a slower pace for the next 24 hours.  Take frequent rest periods for the next 24 hours.  Walking will help get rid of the air and reduce the bloated feeling in your belly (abdomen).  No driving for 24 hours (because of the medicine (anesthesia) used during the test).   Do not sign any important legal documents or operate any machinery for 24 hours (because of the anesthesia used during the test).  NUTRITION Drink plenty of fluids.  You may resume your normal diet as instructed by your doctor.  Begin with a light meal and progress to your normal diet. Heavy or fried foods are harder to digest and may make you feel sick to your stomach (nauseated).  Avoid alcoholic beverages for 24 hours or as instructed.  MEDICATIONS You may resume your normal medications unless your doctor tells you otherwise.  WHAT YOU CAN EXPECT TODAY Some feelings of bloating in the abdomen.  Passage of more gas than usual.  Spotting of blood in your stool or on the toilet paper.  IF YOU HAD POLYPS REMOVED DURING THE COLONOSCOPY: No aspirin products for 7 days or as instructed.  No alcohol for 7 days or as instructed.  Eat a soft diet for the next 24 hours.  FINDING OUT THE RESULTS OF YOUR TEST Not all test results are available during your visit. If your test results are not back during the visit, make an appointment with your caregiver to find out the  results. Do not assume everything is normal if you have not heard from your caregiver or the medical facility. It is important for you to follow up on all of your test results.  SEEK IMMEDIATE MEDICAL ATTENTION IF: You have more than a spotting of blood in your stool.  Your belly is swollen (abdominal distention).  You are nauseated or vomiting.  You have a temperature over 101.  You have abdominal pain or discomfort that is severe or gets worse throughout the day.       Your colonoscopy was normal today.  You do have small hemorrhoids (likely  the cause of microscopic blood in your stool)     it is recommended you return in 10 years for screening colonoscopy      at patient request, I called [place] at [phone] findings and recommendations

## 2022-11-29 NOTE — Transfer of Care (Signed)
Immediate Anesthesia Transfer of Care Note  Patient: Jeremiah Garner  Procedure(s) Performed: COLONOSCOPY WITH PROPOFOL  Patient Location: Short Stay  Anesthesia Type:General  Level of Consciousness: drowsy  Airway & Oxygen Therapy: Patient Spontanous Breathing  Post-op Assessment: Report given to RN and Post -op Vital signs reviewed and stable  Post vital signs: Reviewed and stable  Last Vitals:  Vitals Value Taken Time  BP 92/56   Temp 36.5   Pulse 72   Resp 26   SpO2 92%     Last Pain:  Vitals:   11/29/22 1445  TempSrc:   PainSc: 0-No pain         Complications: No notable events documented.

## 2022-11-29 NOTE — Op Note (Signed)
[facility] Patient Name: Jeremiah Garner Procedure Date: 11/29/2022 2:26 PM MRN: [id number] Date of Birth: May 17, 1976 Attending MD: Gennette Pac , MD, [id number] CSN: [id number] Age: 47 Admit Type: Outpatient Procedure:                Colonoscopy Indications:              Heme positive stool Providers:                Gennette Pac, MD, Edrick Kins, RN,                            Dayton Scrape RN, RN, Jannett Celestine, RN,                            Pandora Leiter, Technician Referring MD:              Medicines:                Propofol per Anesthesia Complications:            No immediate complications. Estimated Blood Loss:     Estimated blood loss: none. Procedure:                Pre-Anesthesia Assessment:                           - Prior to the procedure, a History and Physical                            was performed, and patient medications and                            allergies were reviewed. The patient's tolerance of                            previous anesthesia was also reviewed. The risks                            and benefits of the procedure and the sedation                            options and risks were discussed with the patient.                            All questions were answered, and informed consent                            was obtained. Prior Anticoagulants: The patient has                            taken no anticoagulant or antiplatelet agents. ASA                            Grade Assessment: III - A patient with severe  systemic disease. After reviewing the risks and                            benefits, the patient was deemed in satisfactory                            condition to undergo the procedure.                           After obtaining informed consent, the colonoscope                            was passed under direct vision. Throughout the                            procedure, the patient's  blood pressure, pulse, and                            oxygen saturations were monitored continuously. The                            [id number]) scope was introduced through the                            anus and advanced to the the cecum, identified by                            appendiceal orifice and ileocecal valve. The                            colonoscopy was performed without difficulty. The                            patient tolerated the procedure well. The quality                            of the bowel preparation was adequate. The entire                            colon was well visualized. The ileocecal valve,                            appendiceal orifice, and rectum were photographed. Scope In: 2:47:40 PM Scope Out: 2:59:11 PM Scope Withdrawal Time: 0 hours 8 minutes 18 seconds  Total Procedure Duration: 0 hours 11 minutes 31 seconds  Findings:      The perianal and digital rectal examinations were normal.      Non-bleeding internal hemorrhoids were found during retroflexion. The       hemorrhoids were moderate, medium-sized and Grade I (internal       hemorrhoids that do not prolapse).      Otherwise, the entire examined colon appeared normal. Impression:               - Non-bleeding internal hemorrhoids.                           -  The entire examined colon is normal.                           - No specimens collected. Moderate Sedation:      Moderate (conscious) sedation was personally administered by an       anesthesia professional. The following parameters were monitored: oxygen       saturation, heart rate, blood pressure, respiratory rate, EKG, adequacy       of pulmonary ventilation, and response to care. Recommendation:           - Patient has a contact number available for                            emergencies. The signs and symptoms of potential                            delayed complications were discussed with the                             patient. Return to normal activities tomorrow.                            Written discharge instructions were provided to the                            patient.                           - Resume regular diet.                           - Repeat colonoscopy in 10 years for screening                            purposes.                           - Return to GI office PRN. Procedure Code(s):        --- Professional ---                           [phone], Colonoscopy, flexible; diagnostic, including                            collection of specimen(s) by brushing or washing,                            when performed (separate procedure) Diagnosis Code(s):        --- Professional ---                           K64.0, First degree hemorrhoids                           R19.5, Other fecal abnormalities CPT copyright 2022 American Medical Association. All rights reserved. The codes documented in this report are preliminary and upon coder review  may  be revised to meet current compliance requirements. Gerrit Friends. Yaser Harvill, MD Gennette Pac, MD 11/29/2022 3:07:49 PM This report has been signed electronically. Number of Addenda: 0

## 2022-12-05 ENCOUNTER — Encounter (HOSPITAL_COMMUNITY): Payer: Self-pay | Admitting: Internal Medicine

## 2023-04-16 ENCOUNTER — Ambulatory Visit: Payer: BC Managed Care – PPO | Admitting: Orthopedic Surgery

## 2023-04-16 ENCOUNTER — Encounter: Payer: Self-pay | Admitting: Orthopedic Surgery

## 2023-04-16 VITALS — BP 129/80 | HR 88 | Ht 66.0 in | Wt 278.0 lb

## 2023-04-16 DIAGNOSIS — M65321 Trigger finger, right index finger: Secondary | ICD-10-CM

## 2023-04-16 DIAGNOSIS — M65322 Trigger finger, left index finger: Secondary | ICD-10-CM

## 2023-04-16 NOTE — Patient Instructions (Signed)

## 2023-04-16 NOTE — Progress Notes (Unsigned)
New Patient Visit  Assessment: Jeremiah Garner is a 47 y.o. male with the following: There are no diagnoses linked to this encounter.  Plan: Symptoms and physical exam most consistent with trigger finger.  We discussed etiology, and potential treatment options.  Surgery was discussed in detail, including the plan for procedure, and expected recovery.  After discussing these options, the patient would like to proceed with a steroid injection.  This was completed in clinic today.  Depending on the efficacy of the injection, we could consider another injection.  However, if the injection does not provide sustained relief, I would recommend surgery.  All of this was discussed with the patient, and they are amenable to this plan.  Follow-up as needed.   Procedure note injection - {right/left/bilateral:24895} {Finger:25158} A1 Pulley  Verbal consent was obtained to inject the {right/left/bilateral:24895} {Finger:25158} A1 pulley Timeout was completed to confirm the site of injection.  The skin was prepped with alcohol and ethyl chloride was sprayed at the injection site.  A 21-gauge needle was used to inject 40 mg of Depo-Medrol and 1% lidocaine (1 cc) into the {right/left/bilateral:24895} {Finger:25158} using a direct anterior approach.  There were no complications. Patient tolerated the procedure well. A sterile bandage was applied     Follow-up: Return if symptoms worsen or fail to improve.  Subjective:  Chief Complaint  Patient presents with   Hand Pain    Locking and pain in both index fingers for a few wks R  > L . Worse in the mornings and loosens up as the day goes on.     History of Present Illness: Jeremiah Garner is a 47 y.o. male who presents for evaluation of {right/left/bilateral:24895} {Finger:25158} pain.     Review of Systems: No fevers or chills*** No numbness or tingling No chest pain No shortness of breath No bowel or bladder dysfunction No GI distress No  headaches   Medical History:  Past Medical History:  Diagnosis Date   Diabetes mellitus without complication (HCC)    Hypertension     Past Surgical History:  Procedure Laterality Date   COLONOSCOPY WITH PROPOFOL N/A 11/29/2022   Procedure: COLONOSCOPY WITH PROPOFOL;  Surgeon: Corbin Ade, MD;  Location: [facility];  Service: Endoscopy;  Laterality: N/A;  2:15 pm    Family History  Problem Relation Age of Onset   Cancer Mother        breast    High blood pressure Father    Parkinson's disease Father    Colonic polyp Neg Hx    Colon cancer Neg Hx    Social History   Tobacco Use   Smoking status: Never   Smokeless tobacco: Never  Vaping Use   Vaping status: Never Used  Substance Use Topics   Alcohol use: Not Currently   Drug use: Never    No Known Allergies  No outpatient medications have been marked as taking for the 04/16/23 encounter (Office Visit) with Oliver Barre, MD.    Objective: BP 129/80   Pulse 88   Ht 5\' 6"  (1.676 m)   Wt 278 lb (126.1 kg)   BMI 44.87 kg/m   Physical Exam:  General: {General PE Findings:25791} Gait: {Gait:25792}    IMAGING: No new imaging obtained today   New Medications:  No orders of the defined types were placed in this encounter.     Oliver Barre, MD  04/16/2023 3:02 PM

## 2023-04-17 ENCOUNTER — Encounter: Payer: Self-pay | Admitting: Orthopedic Surgery

## 2023-04-23 ENCOUNTER — Ambulatory Visit: Payer: BC Managed Care – PPO | Admitting: Orthopedic Surgery

## 2023-10-06 ENCOUNTER — Ambulatory Visit
Admission: EM | Admit: 2023-10-06 | Discharge: 2023-10-06 | Disposition: A | Discharge status: Home / Self Care | Attending: Family Medicine | Admitting: Family Medicine

## 2023-10-06 DIAGNOSIS — L02214 Cutaneous abscess of groin: Secondary | ICD-10-CM

## 2023-10-06 MED ORDER — CEPHALEXIN 500 MG PO CAPS: 500.0000 mg | ORAL_CAPSULE | Freq: Two times a day (BID) | ORAL | 0 refills | Status: AC

## 2023-10-06 NOTE — Discharge Instructions (Signed)
 Apply warm compresses, take antibiotics and follow-up if not resolving

## 2023-10-06 NOTE — ED Triage Notes (Signed)
 Pt reports x 2 days he has noticed a knot on his groin.

## 2023-10-10 NOTE — ED Provider Notes (Signed)
 [facility]    CSN: [id number] Arrival date & time: 10/06/23  1718      History   Chief Complaint No chief complaint on file.   HPI Jeremiah Garner is a 47 y.o. male.   Presenting today with 2-day history of very painful mass to the left groin.  Denies fever, chills, injury to the area, nausea, vomiting, diaphoresis, drainage from the area.  So far not trying to thing over-the-counter for symptoms.    Past Medical History:  Diagnosis Date   Diabetes mellitus without complication (HCC)    Hypertension     Patient Active Problem List   Diagnosis Date Noted   BRONCHITIS, ACUTE 08/16/2008   OBESITY 11/21/2006   Essential hypertension 11/21/2006   Allergic rhinitis 11/21/2006   PRURITUS ANI 11/21/2006    Past Surgical History:  Procedure Laterality Date   COLONOSCOPY WITH PROPOFOL N/A 11/29/2022   Procedure: COLONOSCOPY WITH PROPOFOL;  Surgeon: Corbin Ade, MD;  Location: [facility];  Service: Endoscopy;  Laterality: N/A;  2:15 pm       Home Medications    Prior to Admission medications   Medication Sig Start Date End Date Taking? Authorizing Provider  cephALEXin (KEFLEX) 500 MG capsule Take 1 capsule (500 mg total) by mouth 2 (two) times daily. 10/06/23  Yes Particia Nearing, PA-C  cetirizine (ZYRTEC) 10 MG tablet Take 1 tablet (10 mg total) by mouth daily. Patient taking differently: Take 10 mg by mouth daily as needed for allergies. 11/30/21   Leath-Warren, Sadie Haber, NP  diltiazem (TIAZAC) 360 MG 24 hr capsule Take 360 mg by mouth daily. 07/21/19   [provider]  fluticasone (FLONASE) 50 MCG/ACT nasal spray Place 2 sprays into both nostrils daily. Patient taking differently: Place 2 sprays into both nostrils daily as needed for allergies. 09/26/22   Leath-Warren, Sadie Haber, NP  lisinopril-hydrochlorothiazide (ZESTORETIC) 20-25 MG tablet Take 1 tablet by mouth daily. 09/20/22   [provider]  metFORMIN (GLUCOPHAGE) 500  MG tablet Take 500 mg by mouth daily with breakfast. 09/18/22   [provider]  NON FORMULARY Pt uses a cpap nightly    [provider]  simvastatin (ZOCOR) 10 MG tablet Take 10 mg by mouth at bedtime. 09/23/22   [provider]  spironolactone (ALDACTONE) 25 MG tablet Take 25 mg by mouth daily. 07/19/19   [provider]    Family History Family History  Problem Relation Age of Onset   Cancer Mother        breast    High blood pressure Father    Parkinson's disease Father    Colonic polyp Neg Hx    Colon cancer Neg Hx     Social History Social History   Tobacco Use   Smoking status: Never   Smokeless tobacco: Never  Vaping Use   Vaping status: Never Used  Substance Use Topics   Alcohol use: Not Currently   Drug use: Never     Allergies   Patient has no known allergies.   Review of Systems Review of Systems PER HPI  Physical Exam Triage Vital Signs ED Triage Vitals  Encounter Vitals Group     BP 10/06/23 1728 129/78     Systolic BP Percentile --      Diastolic BP Percentile --      Pulse Rate 10/06/23 1728 93     Resp 10/06/23 1728 20     Temp 10/06/23 1728 98.9 F (37.2 C)  Temp Source 10/06/23 1728 Oral     SpO2 10/06/23 1728 94 %     Weight --      Height --      Head Circumference --      Peak Flow --      Pain Score 10/06/23 1737 0     Pain Loc --      Pain Education --      Exclude from Growth Chart --    No data found.  Updated Vital Signs BP 129/78 (BP Location: Right Arm)   Pulse 93   Temp 98.9 F (37.2 C) (Oral)   Resp 20   SpO2 94%   Visual Acuity Right Eye Distance:   Left Eye Distance:   Bilateral Distance:    Right Eye Near:   Left Eye Near:    Bilateral Near:     Physical Exam Vitals and nursing note reviewed.  Constitutional:      Appearance: Normal appearance.  HENT:     Head: Atraumatic.  Eyes:     Extraocular Movements: Extraocular movements intact.     Conjunctiva/sclera:  Conjunctivae normal.  Cardiovascular:     Rate and Rhythm: Normal rate and regular rhythm.  Pulmonary:     Effort: Pulmonary effort is normal.     Breath sounds: Normal breath sounds.  Musculoskeletal:        General: Normal range of motion.     Cervical back: Normal range of motion and neck supple.  Skin:    General: Skin is warm and dry.     Findings: Erythema present.     Comments: Tender fluctuant mass to left groin, no active drainage or bleeding, mild erythema to site  Neurological:     General: No focal deficit present.     Mental Status: He is oriented to person, place, and time.     Motor: No weakness.     Gait: Gait normal.  Psychiatric:        Mood and Affect: Mood normal.        Thought Content: Thought content normal.        Judgment: Judgment normal.      UC Treatments / Results  Labs (all labs ordered are listed, but only abnormal results are displayed) Labs Reviewed - No data to display  EKG   Radiology No results found.  Procedures Procedures (including critical care time)  Medications Ordered in UC Medications - No data to display  Initial Impression / Assessment and Plan / UC Course  I have reviewed the triage vital signs and the nursing notes.  Pertinent labs & imaging results that were available during my care of the patient were reviewed by me and considered in my medical decision making (see chart for details).     Suspect early groin abscess.  No indication for I&D today as the area is still small and should resolve with oral antibiotics and warm compresses.  Keflex sent, return for worsening symptoms.  Final Clinical Impressions(s) / UC Diagnoses   Final diagnoses:  Abscess of groin, left     Discharge Instructions      Apply warm compresses, take antibiotics and follow-up if not resolving    ED Prescriptions     Medication Sig Dispense Auth. Provider   cephALEXin (KEFLEX) 500 MG capsule Take 1 capsule (500 mg total) by  mouth 2 (two) times daily. 14 capsule Particia Nearing, [place]      PDMP not reviewed this encounter.  Particia Nearing, [place] 10/10/23 1042

## 2023-12-03 ENCOUNTER — Ambulatory Visit: Admitting: Orthopedic Surgery

## 2023-12-05 ENCOUNTER — Ambulatory Visit: Admitting: Orthopedic Surgery

## 2023-12-05 ENCOUNTER — Encounter: Payer: Self-pay | Admitting: Orthopedic Surgery

## 2023-12-05 VITALS — BP 112/74

## 2023-12-05 DIAGNOSIS — M65321 Trigger finger, right index finger: Secondary | ICD-10-CM | POA: Diagnosis not present

## 2023-12-05 MED ORDER — METHYLPREDNISOLONE ACETATE 40 MG/ML IJ SUSP
40.0000 mg | Freq: Once | INTRAMUSCULAR | Status: AC
  Administered 2023-12-05: 40 mg via INTRA_ARTICULAR

## 2023-12-05 NOTE — Patient Instructions (Signed)

## 2023-12-05 NOTE — Progress Notes (Signed)
 Return patient Visit  Assessment: Jeremiah Garner is a 48 y.o. male with the following: There are no diagnoses linked to this encounter.  Plan: Jeremiah Garner has recurrence of pain and early triggering of the right index finger.  Previous injection was very helpful.  He would like to proceed with another injection.  We briefly discussed surgery.  All questions have been answered.  If he has any issues, he will contact the clinic.   Procedure note injection - Right Index finger  A1 Pulley  Verbal consent was obtained to inject the Right Index finger  A1 pulley Timeout was completed to confirm the site of injection.  The skin was prepped with alcohol and ethyl chloride was sprayed at the injection site.  A 21-gauge needle was used to inject 40 mg of Depo-Medrol  and 1% lidocaine (1 cc) into the Bilateral  Index finger  using a direct anterior approach.  There were no complications. Patient tolerated the procedure well. A sterile bandage was applied     Follow-up: Return if symptoms worsen or fail to improve.  Subjective:  Chief Complaint  Patient presents with   Pain    Right trigger finger pain- last seen in 2022- started hurting again 3 weeks ago. Wants injection today    History of Present Illness: Jeremiah Garner is a 48 y.o. male who presents for evaluation of Right Index finger  pain.  He is right-hand dominant.  I saw him in clinic greater than 6 months ago.  At that time, he was having issues with both index fingers.  Currently, he is only having some pain, and tightness in the right index finger.  This has been ongoing for about 3 weeks.  He is interested in another injection.   Review of Systems: No fevers or chills No numbness or tingling No chest pain No shortness of breath No bowel or bladder dysfunction No GI distress No headaches    Objective: BP 112/74   Physical Exam:  General: Alert and oriented. and No acute distress. Gait: Normal gait.  Right hand  without deformity.  No swelling.  No redness.  Tenderness palpation over the A1 pulley to the index finger.  He does have some tightness in this area.  No active triggering is witnessed in clinic today.  Sensation is intact throughout the right hand.   IMAGING: No new imaging obtained today   New Medications:  No orders of the defined types were placed in this encounter.     Tonita Frater, MD  12/05/2023 9:25 AM

## 2024-04-20 ENCOUNTER — Ambulatory Visit: Admitting: Orthopedic Surgery

## 2024-08-10 ENCOUNTER — Encounter: Payer: Self-pay | Admitting: Orthopedic Surgery

## 2024-08-10 ENCOUNTER — Ambulatory Visit: Admitting: Orthopedic Surgery

## 2024-08-10 DIAGNOSIS — M65322 Trigger finger, left index finger: Secondary | ICD-10-CM

## 2024-08-10 NOTE — Patient Instructions (Signed)

## 2024-08-10 NOTE — Progress Notes (Signed)
 Return patient Visit  Assessment: Jeremiah Garner is a 49 y.o. male with the following: Left index finger trigger finger  Plan: Mr. Landin has had recurrence of the left index finger trigger finger.  He has previously had an injection, which was very successful.  He is interested in another injection today.  This was completed without issues.  He will return to clinic as needed.   Procedure note injection - Left Index finger  A1 Pulley  Verbal consent was obtained to inject the Left Index finger  A1 pulley Timeout was completed to confirm the site of injection.  The skin was prepped with alcohol and ethyl chloride was sprayed at the injection site.  A 21-gauge needle was used to inject 40 mg of Depo-Medrol  and 1% lidocaine (1 cc) into the Bilateral  Index finger  using a direct anterior approach.  There were no complications. Patient tolerated the procedure well. A sterile bandage was applied     Follow-up: Return if symptoms worsen or fail to improve.  Subjective:  Chief Complaint  Patient presents with   Hand Pain    L index finger locking in place and is stiff would like another injection.     History of Present Illness: Jeremiah Garner is a 49 y.o. male who returns for evaluation of left Index finger  pain.  He is right-hand dominant.  He has had injections in both index fingers in the past.  Over the past few weeks, he started to notice some catching in the left index finger.  It continues to get worse.  He states it is bad in the morning.  It does loosen up over time.   Review of Systems: No fevers or chills No numbness or tingling No chest pain No shortness of breath No bowel or bladder dysfunction No GI distress No headaches    Objective: There were no vitals taken for this visit.  Physical Exam:  General: Alert and oriented. and No acute distress. Gait: Normal gait.  No deformity of either hands.  Tenderness palpation over the A1 pulley to the left  index finger.  Active triggering noted to the left index finger.  Fingers warm well-perfused.  Sensation intact throughout the left hand. IMAGING: No new imaging obtained today   New Medications:  No orders of the defined types were placed in this encounter.     Oneil DELENA Horde, MD  08/10/2024 3:02 PM
